# Patient Record
Sex: Male | Born: 1985 | Hispanic: Yes | Marital: Married | Smoking: Never smoker
Health system: Southern US, Community
[De-identification: ages and names within clinical notes are randomized; demographics above are authoritative.]

## PROBLEM LIST (undated history)

## (undated) DIAGNOSIS — J45909 Unspecified asthma, uncomplicated: Secondary | ICD-10-CM

## (undated) DIAGNOSIS — K429 Umbilical hernia without obstruction or gangrene: Secondary | ICD-10-CM

## (undated) HISTORY — DX: Unspecified asthma, uncomplicated: J45.909

---
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
## 2013-02-02 ENCOUNTER — Encounter (HOSPITAL_COMMUNITY): Payer: Self-pay | Admitting: Emergency Medicine

## 2013-02-02 ENCOUNTER — Emergency Department (HOSPITAL_COMMUNITY)
Admission: EM | Admit: 2013-02-02 | Discharge: 2013-02-02 | Disposition: A | Discharge status: Home / Self Care | Payer: Self-pay | Attending: Emergency Medicine | Admitting: Emergency Medicine

## 2013-02-02 ENCOUNTER — Emergency Department (HOSPITAL_COMMUNITY): Payer: Self-pay

## 2013-02-02 DIAGNOSIS — R1013 Epigastric pain: Secondary | ICD-10-CM | POA: Insufficient documentation

## 2013-02-02 DIAGNOSIS — R109 Unspecified abdominal pain: Secondary | ICD-10-CM

## 2013-02-02 LAB — URINALYSIS, ROUTINE W REFLEX MICROSCOPIC
Bilirubin Urine: NEGATIVE
Nitrite: NEGATIVE
Protein, ur: 30 mg/dL — AB
Specific Gravity, Urine: 1.027 (ref 1.005–1.030)
Urobilinogen, UA: 1 mg/dL (ref 0.0–1.0)

## 2013-02-02 LAB — COMPREHENSIVE METABOLIC PANEL
ALT: 18 U/L (ref 0–53)
BUN: 17 mg/dL (ref 6–23)
CO2: 21 mEq/L (ref 19–32)
Calcium: 9 mg/dL (ref 8.4–10.5)
GFR calc Af Amer: >90 mL/min (ref >90)
GFR calc non Af Amer: >90 mL/min (ref >90)
Glucose, Bld: 91 mg/dL (ref 70–99)
Sodium: 137 mEq/L (ref 135–145)
Total Protein: 7.7 g/dL (ref 6.0–8.3)

## 2013-02-02 LAB — CBC WITH DIFFERENTIAL/PLATELET
Basophils Absolute: 0 10*3/uL (ref 0.0–0.1)
Eosinophils Absolute: 0.1 10*3/uL (ref 0.0–0.7)
Eosinophils Relative: 1 % (ref 0–5)
MCH: 30.8 pg (ref 26.0–34.0)
MCV: 86.6 fL (ref 78.0–100.0)
Neutrophils Relative %: 61 % (ref 43–77)
Platelets: 271 10*3/uL (ref 150–400)
RDW: 12.3 % (ref 11.5–15.5)
WBC: 8 10*3/uL (ref 4.0–10.5)

## 2013-02-02 LAB — URINE MICROSCOPIC-ADD ON

## 2013-02-02 LAB — LIPASE, BLOOD: Lipase: 20 U/L (ref 11–59)

## 2013-02-02 MED ORDER — SODIUM CHLORIDE 0.9 % IV SOLN
1000.0000 mL | INTRAVENOUS | Status: DC
  Administered 2013-02-02 (×2): 1000 mL via INTRAVENOUS

## 2013-02-02 MED ORDER — SODIUM CHLORIDE 0.9 % IV SOLN: 1000.0000 mL | Freq: Once | INTRAVENOUS | Status: DC

## 2013-02-02 MED ORDER — PANTOPRAZOLE SODIUM 20 MG PO TBEC: 20.0000 mg | DELAYED_RELEASE_TABLET | Freq: Every day | ORAL | Status: DC

## 2013-02-02 MED ORDER — HYDROCODONE-ACETAMINOPHEN 5-325 MG PO TABS: 1.0000 | ORAL_TABLET | Freq: Four times a day (QID) | ORAL | Status: DC | PRN

## 2013-02-02 NOTE — ED Provider Notes (Signed)
History     CSN: [id number]  Arrival date & time 02/02/13  1942   First MD Initiated Contact with Patient 02/02/13 2036      Chief Complaint  Patient presents with  . Abdominal Pain    HPI Comments: He notices that it increases with deep breathing.  Patient is a 27 y.o. male presenting with abdominal pain. The history is provided by the patient.  Abdominal Pain Pain location:  Epigastric Pain severity:  Moderate Duration:  3 days Timing:  Constant Progression:  Waxing and waning Context: not alcohol use   Worsened by:  Deep breathing Ineffective treatments:  OTC medications and NSAIDs Associated symptoms: no anorexia, no belching, no chest pain, no dysuria, no fever and no nausea     History reviewed. No pertinent past medical history.  Past Surgical History  Procedure Laterality Date  . Hip surgery      No family history on file.  History  Substance Use Topics  . Smoking status: Never Smoker   . Smokeless tobacco: Not on file  . Alcohol Use: Yes     Comment: occ      Review of Systems  Constitutional: Negative for fever.  Cardiovascular: Negative for chest pain.  Gastrointestinal: Positive for abdominal pain. Negative for nausea and anorexia.  Genitourinary: Negative for dysuria.  All other systems reviewed and are negative.    Allergies  Review of patient's allergies indicates no known allergies.  Home Medications   Current Outpatient Rx  Name  Route  Sig  Dispense  Refill  . ibuprofen (ADVIL,MOTRIN) 200 MG tablet   Oral   Take 400 mg by mouth every 6 (six) hours as needed (pain).         Marland Kitchen HYDROcodone-acetaminophen (NORCO) 5-325 MG per tablet   Oral   Take 1-2 tablets by mouth every 6 (six) hours as needed for pain.   16 tablet   0   . pantoprazole (PROTONIX) 20 MG tablet   Oral   Take 1 tablet (20 mg total) by mouth daily.   20 tablet   0     BP 128/76  Pulse 80  Temp(Src) 98.3 F (36.8 C) (Oral)  Resp 18  Wt 187 lb (84.823  kg)  SpO2 98%  Physical Exam  Nursing note and vitals reviewed. Constitutional: He appears well-developed and well-nourished. No distress.  HENT:  Head: Normocephalic and atraumatic.  Right Ear: External ear normal.  Left Ear: External ear normal.  Eyes: Conjunctivae are normal. Right eye exhibits no discharge. Left eye exhibits no discharge. No scleral icterus.  Neck: Neck supple. No tracheal deviation present.  Cardiovascular: Normal rate, regular rhythm and intact distal pulses.   Pulmonary/Chest: Effort normal and breath sounds normal. No stridor. No respiratory distress. He has no wheezes. He has no rales.  Abdominal: Soft. Bowel sounds are normal. He exhibits no distension and no mass. There is tenderness in the epigastric area. There is no rebound, no guarding and no CVA tenderness. No hernia.  Musculoskeletal: He exhibits no edema and no tenderness.  Neurological: He is alert. He has normal strength. No sensory deficit. Cranial nerve deficit:  no gross defecits noted. He exhibits normal muscle tone. He displays no seizure activity. Coordination normal.  Skin: Skin is warm and dry. No rash noted.  Psychiatric: He has a normal mood and affect.    ED Course  Procedures (including critical care time)  Rate: 65  Rhythm: normal sinus rhythm  QRS Axis: normal  Intervals:  normal  ST/T Wave abnormalities: normal  Conduction Disutrbances:none  Narrative Interpretation:   Old EKG Reviewed: none available  Labs Reviewed  URINALYSIS, ROUTINE W REFLEX MICROSCOPIC - Abnormal; Notable for the following:    APPearance TURBID (*)    Hgb urine dipstick SMALL (*)    Protein, ur 30 (*)    All other components within normal limits  COMPREHENSIVE METABOLIC PANEL  LIPASE, BLOOD  CBC WITH DIFFERENTIAL  URINE MICROSCOPIC-ADD ON   Dg Chest 2 View  02/02/2013  *RADIOLOGY REPORT*  Clinical Data: Epigastric pain  CHEST - 2 VIEW  Comparison: None.  Findings: Lungs are clear. No pleural effusion  or pneumothorax. The cardiomediastinal contours are within normal limits. The visualized bones and soft tissues are without significant appreciable abnormality.  IMPRESSION: No radiographic evidence of acute cardiopulmonary process.   Original Report Authenticated By: Jearld Lesch, M.D.      1. Abdominal pain       MDM  Benign exam.  Will dc home on antacids.  Doubt acute emergency medical condition.        Celene Kras, MD 02/04/13 [phone]

## 2013-02-02 NOTE — ED Notes (Signed)
Pt c/o LUQ pain onset 3 days, worse with breathing and movement, denies injury or recent illness.

## 2013-10-17 IMAGING — CR DG CHEST 2V
2 series · 2 of 2 positions shown · non-contrast
Comparison: None.

CLINICAL DATA: Epigastric pain

CHEST - 2 VIEW

[w chest pa]
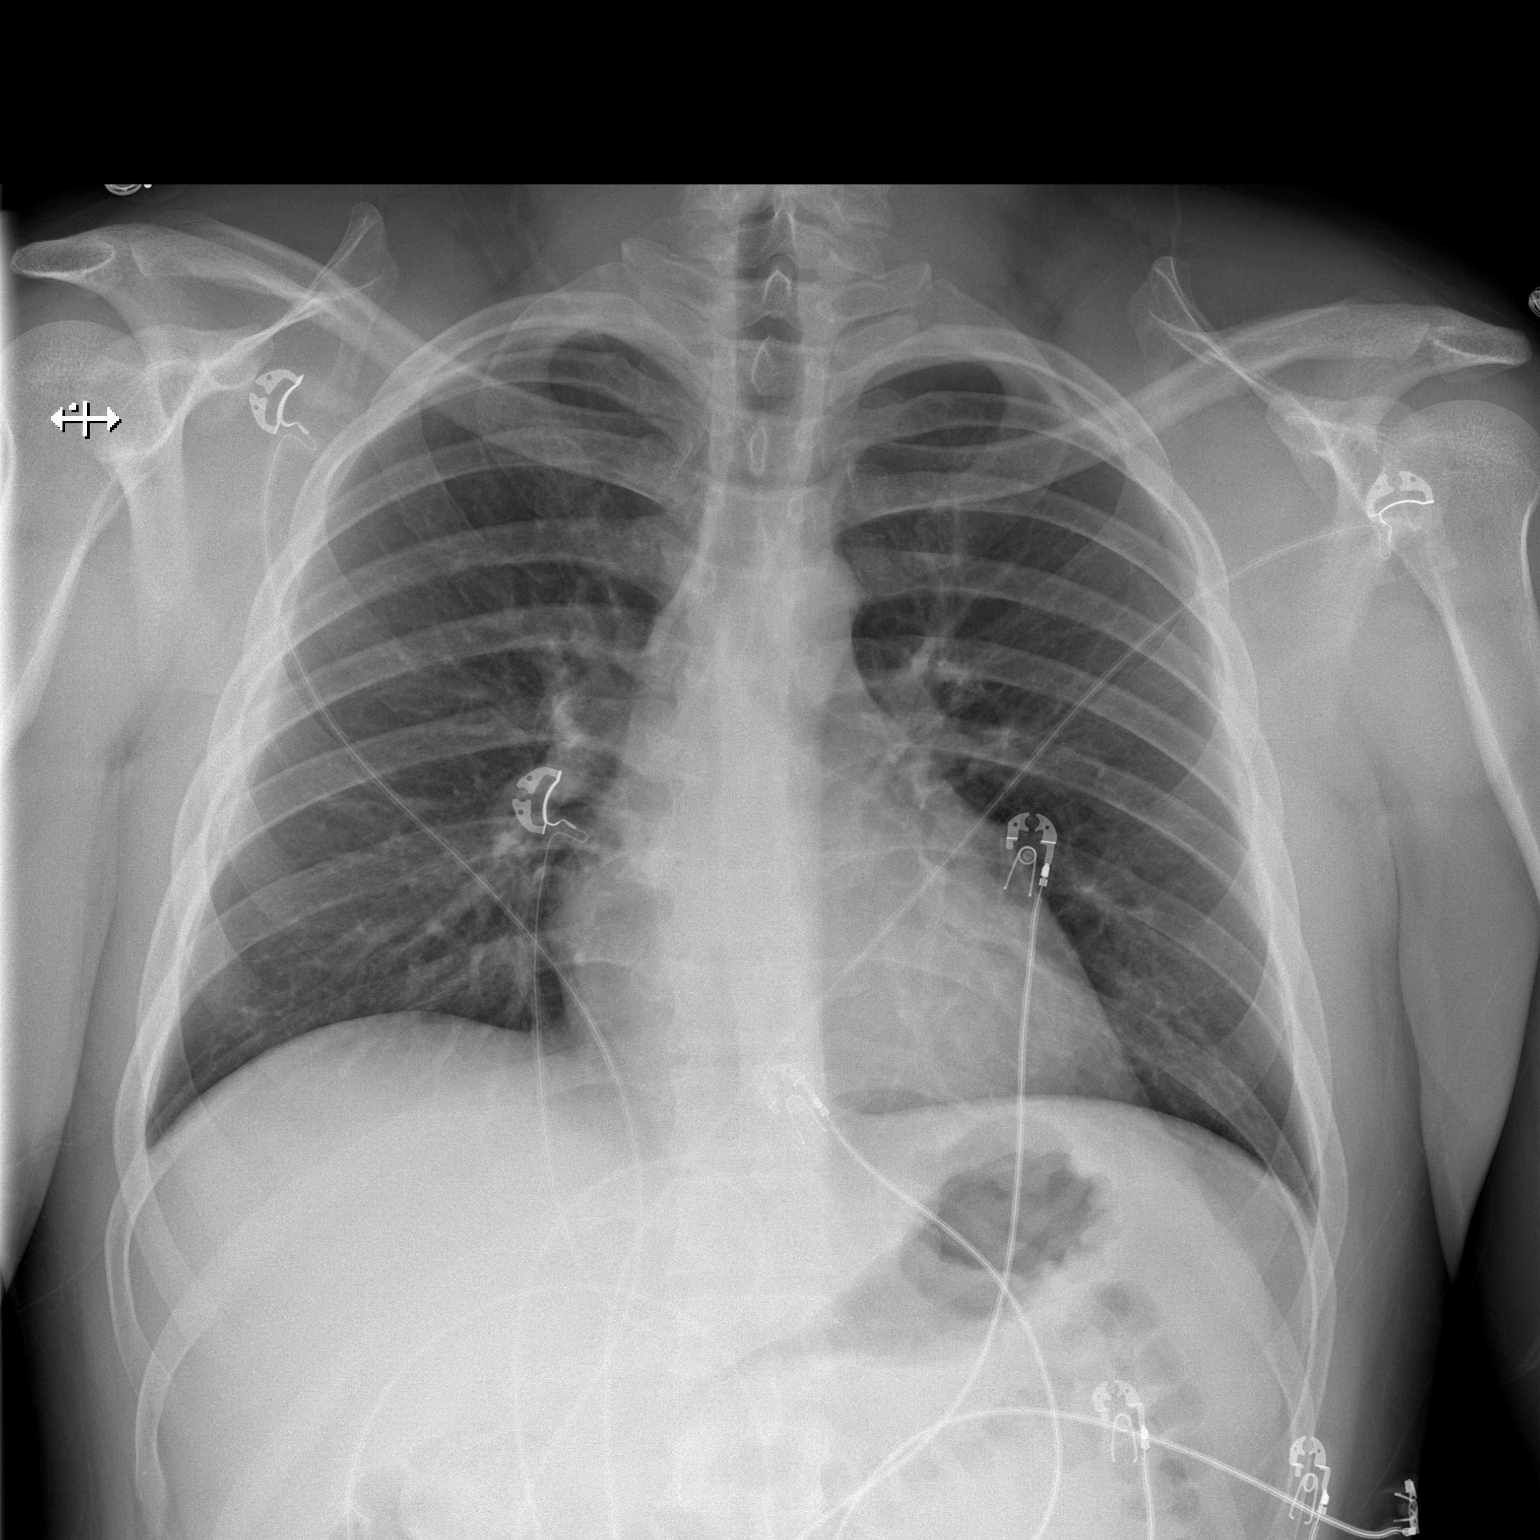

[w chest lat]
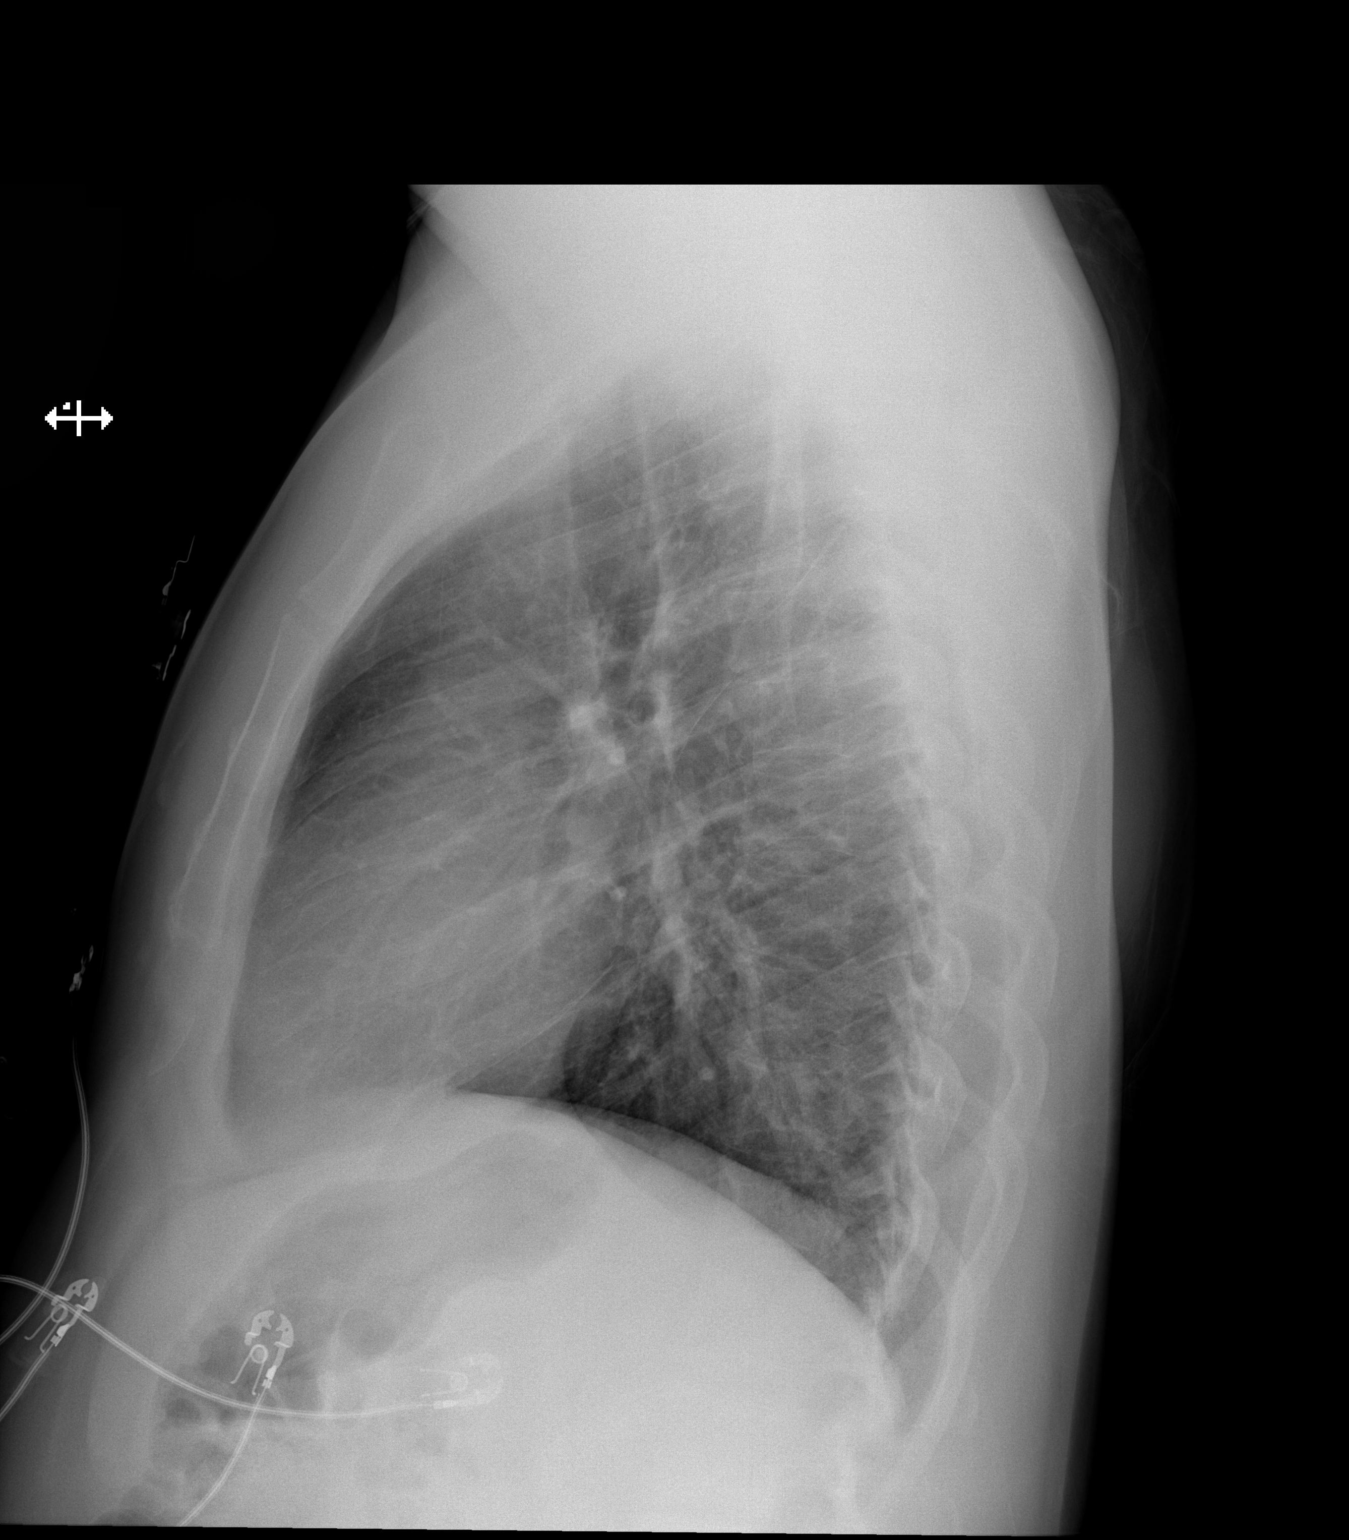

[2 of 2 positions shown; findings below may reference images not displayed]

FINDINGS: Lungs are clear. No pleural effusion or pneumothorax. The
cardiomediastinal contours are within normal limits. The visualized
bones and soft tissues are without significant appreciable
abnormality.
IMPRESSION: No radiographic evidence of acute cardiopulmonary process.

## 2015-02-04 ENCOUNTER — Ambulatory Visit (INDEPENDENT_AMBULATORY_CARE_PROVIDER_SITE_OTHER): Payer: Self-pay | Admitting: Internal Medicine

## 2015-02-04 VITALS — BP 108/76 | HR 66 | Temp 98.4°F | Resp 18 | Ht 66.0 in | Wt 193.0 lb

## 2015-02-04 DIAGNOSIS — J200 Acute bronchitis due to Mycoplasma pneumoniae: Secondary | ICD-10-CM

## 2015-02-04 DIAGNOSIS — J040 Acute laryngitis: Secondary | ICD-10-CM

## 2015-02-04 MED ORDER — HYDROCODONE-ACETAMINOPHEN 7.5-325 MG/15ML PO SOLN: 10.0000 mL | Freq: Four times a day (QID) | ORAL | Status: DC | PRN

## 2015-02-04 MED ORDER — AZITHROMYCIN 500 MG PO TABS: 500.0000 mg | ORAL_TABLET | Freq: Every day | ORAL | Status: DC

## 2015-02-04 NOTE — Patient Instructions (Addendum)
Acute Bronchitis Bronchitis is inflammation of the airways that extend from the windpipe into the lungs (bronchi). The inflammation often causes mucus to develop. This leads to a cough, which is the most common symptom of bronchitis.  In acute bronchitis, the condition usually develops suddenly and goes away over time, usually in a couple weeks. Smoking, allergies, and asthma can make bronchitis worse. Repeated episodes of bronchitis may cause further lung problems.  CAUSES Acute bronchitis is most often caused by the same virus that causes a cold. The virus can spread from person to person (contagious) through coughing, sneezing, and touching contaminated objects. SIGNS AND SYMPTOMS   Cough.   Fever.   Coughing up mucus.   Body aches.   Chest congestion.   Chills.   Shortness of breath.   Sore throat.  DIAGNOSIS  Acute bronchitis is usually diagnosed through a physical exam. Your health care provider will also ask you questions about your medical history. Tests, such as chest X-rays, are sometimes done to rule out other conditions.  TREATMENT  Acute bronchitis usually goes away in a couple weeks. Oftentimes, no medical treatment is necessary. Medicines are sometimes given for relief of fever or cough. Antibiotic medicines are usually not needed but may be prescribed in certain situations. In some cases, an inhaler may be recommended to help reduce shortness of breath and control the cough. A cool mist vaporizer may also be used to help thin bronchial secretions and make it easier to clear the chest.  HOME CARE INSTRUCTIONS  Get plenty of rest.   Drink enough fluids to keep your urine clear or pale yellow (unless you have a medical condition that requires fluid restriction). Increasing fluids may help thin your respiratory secretions (sputum) and reduce chest congestion, and it will prevent dehydration.   Take medicines only as directed by your health care provider.  If  you were prescribed an antibiotic medicine, finish it all even if you start to feel better.  Avoid smoking and secondhand smoke. Exposure to cigarette smoke or irritating chemicals will make bronchitis worse. If you are a smoker, consider using nicotine gum or skin patches to help control withdrawal symptoms. Quitting smoking will help your lungs heal faster.   Reduce the chances of another bout of acute bronchitis by washing your hands frequently, avoiding people with cold symptoms, and trying not to touch your hands to your mouth, nose, or eyes.   Keep all follow-up visits as directed by your health care provider.  SEEK MEDICAL CARE IF: Your symptoms do not improve after 1 week of treatment.  SEEK IMMEDIATE MEDICAL CARE IF:  You develop an increased fever or chills.   You have chest pain.   You have severe shortness of breath.  You have bloody sputum.   You develop dehydration.  You faint or repeatedly feel like you are going to pass out.  You develop repeated vomiting.  You develop a severe headache. MAKE SURE YOU:   Understand these instructions.  Will watch your condition.  Will get help right away if you are not doing well or get worse. Document Released: 12/30/2004 Document Revised: 04/08/2014 Document Reviewed: 05/15/2013 ExitCare Patient Information 2015 [employer]. This information is not intended to replace advice given to you by your health care provider. Make sure you discuss any questions you have with your health care provider. Laryngitis At the top of your windpipe is your voice box. It is the source of your voice. Inside your voice box are 2   bands of muscles called vocal cords. When you breathe, your vocal cords are relaxed and open so that air can get into the lungs. When you decide to say something, these cords come together and vibrate. The sound from these vibrations goes into your throat and comes out through your mouth as sound. Laryngitis is an  inflammation of the vocal cords that causes hoarseness, cough, loss of voice, sore throat, and dry throat. Laryngitis can be temporary (acute) or long-term (chronic). Most cases of acute laryngitis improve with time.Chronic laryngitis lasts for more than 3 weeks. CAUSES Laryngitis can often be related to excessive smoking, talking, or yelling, as well as inhalation of toxic fumes and allergies. Acute laryngitis is usually caused by a viral infection, vocal strain, measles or mumps, or bacterial infections. Chronic laryngitis is usually caused by vocal cord strain, vocal cord injury, postnasal drip, growths on the vocal cords, or acid reflux. SYMPTOMS   Cough.  Sore throat.  Dry throat. RISK FACTORS  Respiratory infections.  Exposure to irritating substances, such as cigarette smoke, excessive amounts of alcohol, stomach acids, and workplace chemicals.  Voice trauma, such as vocal cord injury from shouting or speaking too loud. DIAGNOSIS  Your cargiver will perform a physical exam. During the physical exam, your caregiver will examine your throat. The most common sign of laryngitis is hoarseness. Laryngoscopy may be necessary to confirm the diagnosis of this condition. This procedure allows your caregiver to look into the larynx. HOME CARE INSTRUCTIONS  Drink enough fluids to keep your urine clear or pale yellow.  Rest until you no longer have symptoms or as directed by your caregiver.  Breathe in moist air.  Take all medicine as directed by your caregiver.  Do not smoke.  Talk as little as possible (this includes whispering).  Write on paper instead of talking until your voice is back to normal.  Follow up with your caregiver if your condition has not improved after 10 days. SEEK MEDICAL CARE IF:   You have trouble breathing.  You cough up blood.  You have persistent fever.  You have increasing pain.  You have difficulty swallowing. MAKE SURE YOU:  Understand these  instructions.  Will watch your condition.  Will get help right away if you are not doing well or get worse. Document Released: 11/22/2005 Document Revised: 02/14/2012 Document Reviewed: 01/28/2011 ExitCare Patient Information 2015 [employer]. This information is not intended to replace advice given to you by your health care provider. Make sure you discuss any questions you have with your health care provider.  

## 2015-02-04 NOTE — Progress Notes (Signed)
   Subjective:    Patient ID: Peter Kelly, male    DOB: 07-24-1986, 29 y.o.   MRN: [id number]  HPI Has 4 children who have been sick. He has cough, cp, hoarseness.    Review of Systems     Objective:   Physical Exam  Constitutional: He is oriented to person, place, and time. He appears well-developed and well-nourished. He is active and cooperative. He has a sickly appearance. No distress.  HENT:  Head: Normocephalic.  Nose: Mucosal edema, rhinorrhea and sinus tenderness present. Right sinus exhibits no maxillary sinus tenderness and no frontal sinus tenderness. Left sinus exhibits no maxillary sinus tenderness and no frontal sinus tenderness.  Mouth/Throat: Uvula is midline and mucous membranes are normal. No uvula swelling. Posterior oropharyngeal edema and posterior oropharyngeal erythema present.  Eyes: EOM are normal. Pupils are equal, round, and reactive to light.  Neck: Normal range of motion.  Cardiovascular: Normal rate.   Pulmonary/Chest: Effort normal. No tachypnea. He has no decreased breath sounds. He has no wheezes. He has rhonchi. He has no rales.  Musculoskeletal: Normal range of motion.  Lymphadenopathy:    He has cervical adenopathy.  Neurological: He is alert and oriented to person, place, and time. He exhibits normal muscle tone. Coordination normal.  Psychiatric: He has a normal mood and affect.          Assessment & Plan:  Bronchitis/Laryngitis Zithromax/Lortab

## 2015-06-18 ENCOUNTER — Ambulatory Visit (INDEPENDENT_AMBULATORY_CARE_PROVIDER_SITE_OTHER): Payer: Self-pay | Admitting: Family Medicine

## 2015-06-18 VITALS — BP 108/80 | HR 60 | Temp 98.1°F | Resp 18 | Ht 66.0 in | Wt 198.2 lb

## 2015-06-18 DIAGNOSIS — K429 Umbilical hernia without obstruction or gangrene: Secondary | ICD-10-CM

## 2015-06-18 NOTE — Patient Instructions (Signed)
[facility]  They will plan to see you later this week. They will contact you with the appointment.  If you haven't heard from them by tomorrow please call them at [phone].  Their address is: [address] [place], [place]  If you start feeling worse before seeing them or start having fevers or chills please go to the ER asap.

## 2015-06-18 NOTE — Progress Notes (Signed)
   Subjective:    Patient ID: Peter Kelly, male    DOB: 21-Jul-1986, 29 y.o.   MRN: [id number]  Chief Complaint  Patient presents with  . small lump    under belly button x been there for months  . Abdominal Pain    x 4 days   There are no active problems to display for this patient.  Prior to Admission medications   Medication Sig Start Date End Date Taking? Authorizing Provider  pantoprazole (PROTONIX) 20 MG tablet Take 1 tablet (20 mg total) by mouth daily. Patient not taking: Reported on 02/04/2015 02/02/13   Linwood DibblesJon Knapp, MD   Medications, allergies, past medical history, surgical history, family history, social history and problem list reviewed and updated.  HPI  [age] yom presents with abd lump.   Has had known umbilical lump which he thought was a hernia for past 6 months. Has never been able to reduce it. Four days ago was lifting heavy boxes and noticed pain at site of hernia. Pain has gradually worsened past few days.   Denies fevers, chills, n/v, diarrhea. Has been able to eat ok.   Review of Systems See HPI.     Objective:   Physical Exam  Constitutional: He is oriented to person, place, and time.  Non-toxic appearance. He does not have a sickly appearance. He does not appear ill.  BP 108/80 mmHg  Pulse 60  Temp(Src) 98.1 F (36.7 C) (Oral)  Resp 18  Ht 5\' 6"  (1.676 m)  Wt 198 lb 3.2 oz (89.903 kg)  BMI 32.01 kg/m2  SpO2 98%   Abdominal: Soft. Normal appearance and bowel sounds are normal. There is generalized tenderness. A hernia is present. Hernia confirmed positive in the ventral area.    Mild generalized abd ttp. TTP worst just distal to umbilicus, at site of approx 1 cm soft bulge that is not reducible. No surrounding erythema, no warmth.   Neurological: He is alert and oriented to person, place, and time.      Assessment & Plan:   Umbilical hernia, recurrence not specified --bulge likely small umbilical hernia --not reducible but no signs  strangulation with no surrounding erythema, warmth, no n/v, normal vitals --Dr Patsy Lageropland [facility], they will plan to see pt later this week --er if pain worsens or starts having fevers, chills, nausea  Donnajean Lopesodd M. Ellese Julius, PA-C Physician Assistant-Certified [facility]  06/18/2015 2:10 PM

## 2015-06-20 ENCOUNTER — Other Ambulatory Visit: Payer: Self-pay | Admitting: General Surgery

## 2015-06-23 ENCOUNTER — Encounter (HOSPITAL_COMMUNITY): Payer: Self-pay | Admitting: *Deleted

## 2015-06-24 ENCOUNTER — Ambulatory Visit (HOSPITAL_COMMUNITY): Payer: Self-pay | Admitting: Anesthesiology

## 2015-06-24 ENCOUNTER — Encounter (HOSPITAL_COMMUNITY)
Admission: RE | Disposition: A | Discharge status: Home / Self Care | Payer: Self-pay | Source: Ambulatory Visit | Attending: General Surgery

## 2015-06-24 ENCOUNTER — Ambulatory Visit (HOSPITAL_COMMUNITY)
Admission: RE | Admit: 2015-06-24 | Discharge: 2015-06-24 | Disposition: A | Discharge status: Home / Self Care | Payer: Self-pay | Source: Ambulatory Visit | Attending: General Surgery | Admitting: General Surgery

## 2015-06-24 ENCOUNTER — Encounter (HOSPITAL_COMMUNITY): Payer: Self-pay

## 2015-06-24 DIAGNOSIS — Z859 Personal history of malignant neoplasm, unspecified: Secondary | ICD-10-CM | POA: Insufficient documentation

## 2015-06-24 DIAGNOSIS — K420 Umbilical hernia with obstruction, without gangrene: Secondary | ICD-10-CM | POA: Insufficient documentation

## 2015-06-24 DIAGNOSIS — J45909 Unspecified asthma, uncomplicated: Secondary | ICD-10-CM | POA: Insufficient documentation

## 2015-06-24 HISTORY — DX: Umbilical hernia without obstruction or gangrene: K42.9

## 2015-06-24 HISTORY — PX: UMBILICAL HERNIA REPAIR: SHX196

## 2015-06-24 HISTORY — PX: INSERTION OF MESH: SHX5868

## 2015-06-24 LAB — CBC
HCT: 42 % (ref 39.0–52.0)
HEMOGLOBIN: 14.6 g/dL (ref 13.0–17.0)
MCH: 31.5 pg (ref 26.0–34.0)
MCHC: 34.8 g/dL (ref 30.0–36.0)
MCV: 90.7 fL (ref 78.0–100.0)
PLATELETS: 261 10*3/uL (ref 150–400)
RBC: 4.63 MIL/uL (ref 4.22–5.81)
RDW: 12.6 % (ref 11.5–15.5)
WBC: 8 10*3/uL (ref 4.0–10.5)

## 2015-06-24 SURGERY — REPAIR, HERNIA, UMBILICAL, ADULT
Anesthesia: General

## 2015-06-24 MED ORDER — LIDOCAINE HCL (CARDIAC) 20 MG/ML IV SOLN
INTRAVENOUS | Status: AC
  Filled 2015-06-24: qty 5

## 2015-06-24 MED ORDER — CEFAZOLIN SODIUM-DEXTROSE 2-3 GM-% IV SOLR
2.0000 g | INTRAVENOUS | Status: AC
  Administered 2015-06-24: 2 g via INTRAVENOUS

## 2015-06-24 MED ORDER — PROPOFOL 10 MG/ML IV BOLUS
INTRAVENOUS | Status: DC | PRN
  Administered 2015-06-24: 200 mg via INTRAVENOUS

## 2015-06-24 MED ORDER — HYDROMORPHONE HCL 1 MG/ML IJ SOLN
INTRAMUSCULAR | Status: DC
Start: 2015-06-24 — End: 2015-06-24
  Filled 2015-06-24: qty 1

## 2015-06-24 MED ORDER — FENTANYL CITRATE (PF) 100 MCG/2ML IJ SOLN
INTRAMUSCULAR | Status: DC | PRN
  Administered 2015-06-24 (×3): 50 ug via INTRAVENOUS

## 2015-06-24 MED ORDER — DEXAMETHASONE SODIUM PHOSPHATE 10 MG/ML IJ SOLN
INTRAMUSCULAR | Status: AC
Start: 2015-06-24 — End: 2015-06-24
  Filled 2015-06-24: qty 1

## 2015-06-24 MED ORDER — LACTATED RINGERS IV SOLN
INTRAVENOUS | Status: DC
  Administered 2015-06-24: 1000 mL via INTRAVENOUS

## 2015-06-24 MED ORDER — ROCURONIUM BROMIDE 100 MG/10ML IV SOLN
INTRAVENOUS | Status: AC
Start: 2015-06-24 — End: 2015-06-24
  Filled 2015-06-24: qty 1

## 2015-06-24 MED ORDER — PROPOFOL 10 MG/ML IV BOLUS
INTRAVENOUS | Status: AC
  Filled 2015-06-24: qty 20

## 2015-06-24 MED ORDER — EPHEDRINE SULFATE 50 MG/ML IJ SOLN
INTRAMUSCULAR | Status: AC
  Filled 2015-06-24: qty 1

## 2015-06-24 MED ORDER — CHLORHEXIDINE GLUCONATE 4 % EX LIQD: 1.0000 "application " | Freq: Once | CUTANEOUS | Status: DC

## 2015-06-24 MED ORDER — MIDAZOLAM HCL 5 MG/5ML IJ SOLN
INTRAMUSCULAR | Status: DC | PRN
  Administered 2015-06-24: 2 mg via INTRAVENOUS

## 2015-06-24 MED ORDER — SODIUM CHLORIDE 0.9 % IJ SOLN
INTRAMUSCULAR | Status: AC
Start: 2015-06-24 — End: 2015-06-24
  Filled 2015-06-24: qty 10

## 2015-06-24 MED ORDER — PROMETHAZINE HCL 25 MG/ML IJ SOLN: 6.2500 mg | INTRAMUSCULAR | Status: DC | PRN

## 2015-06-24 MED ORDER — BUPIVACAINE-EPINEPHRINE (PF) 0.25% -1:200000 IJ SOLN
INTRAMUSCULAR | Status: AC
  Filled 2015-06-24: qty 30

## 2015-06-24 MED ORDER — ONDANSETRON HCL 4 MG/2ML IJ SOLN
INTRAMUSCULAR | Status: AC
Start: 2015-06-24 — End: 2015-06-24
  Filled 2015-06-24: qty 2

## 2015-06-24 MED ORDER — LIDOCAINE HCL (CARDIAC) 20 MG/ML IV SOLN
INTRAVENOUS | Status: DC | PRN
  Administered 2015-06-24: 50 mg via INTRAVENOUS

## 2015-06-24 MED ORDER — OXYCODONE HCL 5 MG PO TABS: 5.0000 mg | ORAL_TABLET | ORAL | Status: DC | PRN

## 2015-06-24 MED ORDER — FENTANYL CITRATE (PF) 250 MCG/5ML IJ SOLN
INTRAMUSCULAR | Status: AC
  Filled 2015-06-24: qty 5

## 2015-06-24 MED ORDER — BUPIVACAINE-EPINEPHRINE 0.25% -1:200000 IJ SOLN
INTRAMUSCULAR | Status: DC | PRN
  Administered 2015-06-24: 30 mL

## 2015-06-24 MED ORDER — ONDANSETRON HCL 4 MG/2ML IJ SOLN
INTRAMUSCULAR | Status: DC | PRN
  Administered 2015-06-24: 4 mg via INTRAVENOUS

## 2015-06-24 MED ORDER — MIDAZOLAM HCL 2 MG/2ML IJ SOLN
INTRAMUSCULAR | Status: AC
Start: 2015-06-24 — End: 2015-06-24
  Filled 2015-06-24: qty 2

## 2015-06-24 MED ORDER — CEFAZOLIN SODIUM-DEXTROSE 2-3 GM-% IV SOLR
INTRAVENOUS | Status: AC
  Filled 2015-06-24: qty 50

## 2015-06-24 MED ORDER — DEXAMETHASONE SODIUM PHOSPHATE 10 MG/ML IJ SOLN
INTRAMUSCULAR | Status: DC | PRN
  Administered 2015-06-24: 10 mg via INTRAVENOUS

## 2015-06-24 MED ORDER — HYDROMORPHONE HCL 1 MG/ML IJ SOLN
0.2500 mg | INTRAMUSCULAR | Status: DC | PRN
  Administered 2015-06-24 (×2): 0.5 mg via INTRAVENOUS

## 2015-06-24 SURGICAL SUPPLY — 37 items
BENZOIN TINCTURE PRP APPL 2/3 (GAUZE/BANDAGES/DRESSINGS) IMPLANT
BLADE HEX COATED 2.75 (ELECTRODE) ×3 IMPLANT
BLADE SURG SZ10 CARB STEEL (BLADE) ×3 IMPLANT
CLOSURE WOUND 1/2 X4 (GAUZE/BANDAGES/DRESSINGS)
COVER SURGICAL LIGHT HANDLE (MISCELLANEOUS) ×3 IMPLANT
DECANTER SPIKE VIAL GLASS SM (MISCELLANEOUS) IMPLANT
DRAPE LAPAROTOMY T 102X78X121 (DRAPES) ×3 IMPLANT
ELECT REM PT RETURN 9FT ADLT (ELECTROSURGICAL) ×3
ELECTRODE REM PT RTRN 9FT ADLT (ELECTROSURGICAL) ×1 IMPLANT
GAUZE SPONGE 4X4 12PLY STRL (GAUZE/BANDAGES/DRESSINGS) IMPLANT
GLOVE BIOGEL PI IND STRL 7.0 (GLOVE) ×1 IMPLANT
GLOVE BIOGEL PI INDICATOR 7.0 (GLOVE) ×2
GOWN STRL REUS W/TWL LRG LVL3 (GOWN DISPOSABLE) ×3 IMPLANT
GOWN STRL REUS W/TWL XL LVL3 (GOWN DISPOSABLE) ×6 IMPLANT
KIT BASIN OR (CUSTOM PROCEDURE TRAY) ×3 IMPLANT
LIQUID BAND (GAUZE/BANDAGES/DRESSINGS) ×6 IMPLANT
MARKER SKIN DUAL TIP RULER LAB (MISCELLANEOUS) IMPLANT
MESH VENTRALEX ST 1-7/10 CRC S (Mesh General) ×3 IMPLANT
NEEDLE HYPO 22GX1.5 SAFETY (NEEDLE) ×3 IMPLANT
NEEDLE HYPO 25X1 1.5 SAFETY (NEEDLE) IMPLANT
NS IRRIG 1000ML POUR BTL (IV SOLUTION) ×6 IMPLANT
PACK BASIC VI WITH GOWN DISP (CUSTOM PROCEDURE TRAY) ×3 IMPLANT
PENCIL BUTTON HOLSTER BLD 10FT (ELECTRODE) ×3 IMPLANT
SPONGE LAP 4X18 X RAY DECT (DISPOSABLE) ×3 IMPLANT
STRIP CLOSURE SKIN 1/2X4 (GAUZE/BANDAGES/DRESSINGS) IMPLANT
SUT MNCRL AB 4-0 PS2 18 (SUTURE) ×3 IMPLANT
SUT NOVA 0 T19/GS 22DT (SUTURE) ×3 IMPLANT
SUT PROLENE 0 CT 1 30 (SUTURE) IMPLANT
SUT PROLENE 0 CT 1 CR/8 (SUTURE) IMPLANT
SUT PROLENE 0 CT 2 (SUTURE) IMPLANT
SUT SILK 3 0 (SUTURE)
SUT SILK 3 0 SH CR/8 (SUTURE) IMPLANT
SUT SILK 3-0 18XBRD TIE 12 (SUTURE) IMPLANT
SUT VIC AB 3-0 SH 27 (SUTURE)
SUT VIC AB 3-0 SH 27XBRD (SUTURE) IMPLANT
SYR CONTROL 10ML LL (SYRINGE) ×3 IMPLANT
TOWEL OR 17X26 10 PK STRL BLUE (TOWEL DISPOSABLE) ×3 IMPLANT

## 2015-06-24 NOTE — H&P (Signed)
  History of Present Illness Peter Kelly(Conny Situ T. Appolonia Ackert MD; 06/20/2015 11:34 AM) Patient words: umb hernia.  The patient is a 29 year old male who presents with an umbilical hernia. He comes to the office with recent onset of the pain and bulge at his umbilicus. 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 He was doing heavy lifting about 2 weeks ago and developed stinging pain and some swelling in his umbilicus. This has persisted since that time. Not severe pain but uncomfortable. He is felt maybe a little nausea today. No previous history of surgery in that area.   Other Problems Peter Kelly(Ashley Beck, CMA; 06/20/2015 11:10 AM) Other disease, cancer, significant illness  Past Surgical History Peter Kelly(Ashley Beck, CMA; 06/20/2015 11:10 AM) No pertinent past surgical history  Allergies Peter Kelly(Ashley Beck, CMA; 06/20/2015 11:10 AM) No Known Drug Allergies07/15/2016  Medication History Peter Kelly(Ashley Beck, CMA; 06/20/2015 11:11 AM) No Current Medications Medications Reconciled  Social History Peter Kelly(Ashley Beck, CMA; 06/20/2015 11:10 AM) Alcohol use Occasional alcohol use. Caffeine use Carbonated beverages.  Family History Peter Kelly(Ashley Beck, [place]; 06/20/2015 11:10 AM) Cervical Cancer Mother. Diabetes Mellitus Family Members In General.  Review of Systems Peter Kelly(Ashley Beck CMA; 06/20/2015 11:10 AM) General Present- Appetite Loss and Fatigue. Not Present- Chills, Fever, Night Sweats, Weight Gain and Weight Loss. Skin Not Present- Change in Wart/Mole, Dryness, Hives, Jaundice, New Lesions, Non-Healing Wounds, Rash and Ulcer. HEENT Present- Sore Throat. Not Present- Earache, Hearing Loss, Hoarseness, Nose Bleed, Oral Ulcers, Ringing in the Ears, Seasonal Allergies, Sinus Pain, Visual Disturbances, Wears glasses/contact lenses and Yellow Eyes. Breast Not Present- Breast Mass, Breast Pain, Nipple Discharge and Skin Changes. Cardiovascular Present- Chest Pain and Shortness of Breath. Not Present- Difficulty Breathing Lying Down, Leg Cramps,  Palpitations, Rapid Heart Rate and Swelling of Extremities. Gastrointestinal Present- Abdominal Pain, Bloating and Excessive gas. Not Present- Bloody Stool, Change in Bowel Habits, Chronic diarrhea, Constipation, Difficulty Swallowing, Gets full quickly at meals, Hemorrhoids, Indigestion, Nausea, Rectal Pain and Vomiting. Male Genitourinary Not Present- Blood in Urine, Change in Urinary Stream, Frequency, Impotence, Nocturia, Painful Urination, Urgency and Urine Leakage.   Vitals Peter Kelly(Ashley Beck CMA; 06/20/2015 11:11 AM) 06/20/2015 11:11 AM Weight: 198 lb Height: 66in Body Surface Area: 2.05 m Body Mass Index: 31.96 kg/m Temp.: 98.34F(Oral)  Pulse: 85 (Regular)  BP: 128/70 (Sitting, Left Arm, Standard)    Physical Exam Peter Kelly(Olajuwon Fosdick T. Brantlee Hinde MD; 06/20/2015 11:35 AM) The physical exam findings are as follows: Note:General: Healthy-appearing Hispanic male no distress Skin: No rash or infection Lungs: Clear equal breath sounds bilaterally Cardiac: Regular rate and rhythm Abdomen: Nondistended. Generally soft and nontender. There is an approximately 1.5 cm slightly tender noninflamed soft mass at the umbilicus consistent with an incarcerated umbilical hernia probably preperitoneal fat but no inflammation.    Assessment & Plan Peter Kelly(Dacoda Finlay T. Margareta Laureano MD; 06/20/2015 11:37 AM) UMBILICAL HERNIA, INCARCERATED (552.1  K42.0) Impression: Small symptomatic incarcerated umbilical hernia probably containing preperitoneal fat. I discussed the diagnosis with the patient and recommended repair under general anesthesia as an outpatient with mesh. We discussed the procedure and recovery as well as risks of bleeding, infection, recurrence. He was given literature regarding the procedure. We'll schedule this urgently. Current Plans  Pt Education - Pamphlet Given - Hernia Surgery: discussed with patient and provided information. Schedule for Surgery Repair of umbilical hernia with mesh under general  anesthesia as an outpatient

## 2015-06-24 NOTE — Anesthesia Procedure Notes (Signed)
Procedure Name: LMA Insertion Date/Time: 06/24/2015 11:05 AM Performed by: Thornell MuleSTUBBLEFIELD, Keneth Borg G Pre-anesthesia Checklist: Patient identified, Emergency Drugs available, Suction available, Patient being monitored and Timeout performed Patient Re-evaluated:Patient Re-evaluated prior to inductionOxygen Delivery Method: Circle system utilized Preoxygenation: Pre-oxygenation with 100% oxygen Intubation Type: IV induction Ventilation: Mask ventilation without difficulty LMA: LMA inserted LMA Size: 4.0 Number of attempts: 1 Placement Confirmation: positive ETCO2 and breath sounds checked- equal and bilateral Tube secured with: Tape

## 2015-06-24 NOTE — Transfer of Care (Signed)
Immediate Anesthesia Transfer of Care Note  Patient: Peter Kelly  Procedure(s) Performed: Procedure(s): UMBILICAL HERNIA REPAIR  (N/A) INSERTION OF MESH (N/A)  Patient Location: PACU  Anesthesia Type:General  Level of Consciousness: awake, alert  and oriented  Airway & Oxygen Therapy: Patient Spontanous Breathing and Patient connected to face mask oxygen  Post-op Assessment: Report given to RN and Post -op Vital signs reviewed and stable  Post vital signs: Reviewed and stable  Last Vitals:  Filed Vitals:   06/24/15 0951  BP: 126/79  Pulse: 63  Temp: 37.1 C  Resp: 16    Complications: No apparent anesthesia complications

## 2015-06-24 NOTE — Anesthesia Preprocedure Evaluation (Signed)
Anesthesia Evaluation  Patient identified by MRN, date of birth, ID band Patient awake    Reviewed: Allergy & Precautions, NPO status , Patient's Chart, lab work & pertinent test results  Airway Mallampati: II  TM Distance: >3 FB Neck ROM: Full    Dental   Pulmonary asthma ,  breath sounds clear to auscultation        Cardiovascular negative cardio ROS  Rhythm:Regular Rate:Normal     Neuro/Psych    GI/Hepatic Neg liver ROS, GI history noted. CE   Endo/Other    Renal/GU      Musculoskeletal   Abdominal   Peds  Hematology   Anesthesia Other Findings   Reproductive/Obstetrics                             Anesthesia Physical Anesthesia Plan  ASA: II  Anesthesia Plan: General   Post-op Pain Management:    Induction: Intravenous  Airway Management Planned: LMA  Additional Equipment:   Intra-op Plan:   Post-operative Plan: Extubation in OR  Informed Consent: I have reviewed the patients History and Physical, chart, labs and discussed the procedure including the risks, benefits and alternatives for the proposed anesthesia with the patient or authorized representative who has indicated his/her understanding and acceptance.   Dental advisory given  Plan Discussed with: CRNA and Anesthesiologist  Anesthesia Plan Comments:         Anesthesia Quick Evaluation

## 2015-06-24 NOTE — Op Note (Signed)
Preoperative Diagnosis: umbilical hernia  Postoprative Diagnosis: umbilical hernia  Procedure: Procedure(s): UMBILICAL HERNIA REPAIR  INSERTION OF MESH   Surgeon: Glenna FellowsHoxworth, Jahir Halt T   Assistants: none  Anesthesia:  General LMA anesthesia  Indications: patient is a 29 year old male who presents with about 2 weeks of a somewhat painful very tender mass at his umbilicus. Exam is consistent with a chronically incarcerated small umbilical hernia. I recommended repair under general anesthesia. We have discussed the procedure and risks, indications and recovery detailed elsewhere.    Procedure Detail:  Patient was brought to the operating room, placed in the supine position on the operating table, and laryngeal mask general anesthesia induced. He received preoperative IV antibiotics. PAS were in place. The abdomen was widely sterilely prepped and draped. Patient timeout was performed and correct procedure verified. A curvilinear incision just beneath the umbilicus was used and dissection carried down through the subcutaneous tissue. There was obvious incarcerated preperitoneal fat. The umbilical skin was dissected up off of the level of the fascia and the small defect and hernia contents completely exposed. The herniated preperitoneal fat was amputated with cautery at the level of the fascia in the defect itself measured only about half a centimeter. The fascia was identified in all directions. The peritoneum was opened. A 4.5 cm ventral patch was used, deployed into the peritoneal cavity and then deployed out laterally in all directions smoothly. With the tails pulled up snugly the fascial defect was closed transversely with 3 interrupted 0 Novafil sutures incorporating the tails of the mesh which were then trimmed away. Hemostasis was assured. The soft tissue was infiltrated with Marcaine. The umbilical skin was tacked back down to the level of the fascia. The subcutaneous was closed with interrupted  4-0 Monocryl in the skin and subcutaneous 4-0 Monocryl and Dermabond. Sponge needle and instrument counts were correct.    Findings: As above  Estimated Blood Loss:  Minimal         Drains: none  Blood Given: none          Specimens: none        Complications:  * No complications entered in OR log *         Disposition: PACU - hemodynamically stable.         Condition: stable

## 2015-06-24 NOTE — Discharge Instructions (Signed)
[facility] _______[facility]  UMBILICAL OR INGUINAL HERNIA REPAIR: POST OP INSTRUCTIONS  Always review your discharge instruction sheet given to you by the facility where your surgery was performed. IF YOU HAVE DISABILITY OR FAMILY LEAVE FORMS, YOU MUST BRING THEM TO THE OFFICE FOR PROCESSING.   DO NOT GIVE THEM TO YOUR DOCTOR.  1. A  prescription for pain medication may be given to you upon discharge.  Take your pain medication as prescribed, if needed.  If narcotic pain medicine is not needed, then you may take acetaminophen (Tylenol) or ibuprofen (Advil) as needed. 2. Take your usually prescribed medications unless otherwise directed. 3. If you need a refill on your pain medication, please contact your pharmacy.  They will contact our office to request authorization. Prescriptions will not be filled after 5 pm or on week-ends. 4. You should follow a light diet the first 24 hours after arrival home, such as soup and crackers, etc.  Be sure to include lots of fluids daily.  Resume your normal diet the day after surgery. 5. Most patients will experience some swelling and bruising around the umbilicus or in the groin and scrotum.  Ice packs and reclining will help.  Swelling and bruising can take several days to resolve.  6. It is common to experience some constipation if taking pain medication after surgery.  Increasing fluid intake and taking a stool softener (such as Colace) will usually help or prevent this problem from occurring.  A mild laxative (Milk of Magnesia or Miralax) should be taken according to package directions if there are no bowel movements after 48 hours. 7. Unless discharge instructions indicate otherwise, you may remove your bandages 24-48 hours after surgery, and you may shower at that time.  You may have steri-strips (small skin tapes) in place directly over the incision.  These strips should be left on the skin for 7-10 days.  If your surgeon used skin glue on the  incision, you may shower in 24 hours.  The glue will flake off over the next 2-3 weeks.  Any sutures or staples will be removed at the office during your follow-up visit. 8. ACTIVITIES:  You may resume regular (light) daily activities beginning the next day--such as daily self-care, walking, climbing stairs--gradually increasing activities as tolerated.  You may have sexual intercourse when it is comfortable.  Refrain from any heavy lifting or straining until approved by your doctor. a. You may drive when you are no longer taking prescription pain medication, you can comfortably wear a seatbelt, and you can safely maneuver your car and apply brakes. b. RETURN TO WORK:  __________________________________________________________ 9. You should see your doctor in the office for a follow-up appointment approximately 2-3 weeks after your surgery.  Make sure that you call for this appointment within a day or two after you arrive home to insure a convenient appointment time. 10. OTHER INSTRUCTIONS:  __________________________________________________________________________________________________________________________________________________________________________________________  WHEN TO CALL YOUR DOCTOR: 1. Fever over 101.0 2. Inability to urinate 3. Nausea and/or vomiting 4. Extreme swelling or bruising 5. Continued bleeding from incision. 6. Increased pain, redness, or drainage from the incision  The clinic staff is available to answer your questions during regular business hours.  Please don't hesitate to call and ask to speak to one of the nurses for clinical concerns.  If you have a medical emergency, go to the nearest emergency room or call 911.  A surgeon from [facility] is always on call at the hospital     [address], [address], [place], [place]  [address] ?  [address] [address], [place],    [address] [phone] ? [phone] ? FAX [phone] Web site:  [email or web address]  

## 2015-06-24 NOTE — Interval H&P Note (Signed)
History and Physical Interval Note:  06/24/2015 10:46 AM  Peter Kelly  has presented today for surgery, with the diagnosis of umbilical hernia  The various methods of treatment have been discussed with the patient and family. After consideration of risks, benefits and other options for treatment, the patient has consented to  Procedure(s): UMBILICAL HERNIA REPAIR  (N/A) INSERTION OF MESH (N/A) as a surgical intervention .  The patient's history has been reviewed, patient examined, no change in status, stable for surgery.  I have reviewed the patient's chart and labs.  Questions were answered to the patient's satisfaction.     Zane Pellecchia T

## 2015-06-24 NOTE — Anesthesia Postprocedure Evaluation (Signed)
  Anesthesia Post-op Note  Patient: Peter Kelly  Procedure(s) Performed: Procedure(s): UMBILICAL HERNIA REPAIR  (N/A) INSERTION OF MESH (N/A)  Patient Location: PACU  Anesthesia Type:General  Level of Consciousness: awake  Airway and Oxygen Therapy: Patient Spontanous Breathing  Post-op Pain: mild  Post-op Assessment: Post-op Vital signs reviewed              Post-op Vital Signs: Reviewed  Last Vitals:  Filed Vitals:   06/24/15 1254  BP: 103/65  Pulse: 59  Temp:   Resp: 18    Complications: No apparent anesthesia complications

## 2015-06-25 ENCOUNTER — Encounter (HOSPITAL_COMMUNITY): Payer: Self-pay | Admitting: General Surgery

## 2018-04-21 ENCOUNTER — Ambulatory Visit: Payer: Self-pay | Admitting: Family Medicine

## 2018-04-25 ENCOUNTER — Ambulatory Visit: Payer: Self-pay | Admitting: Family Medicine

## 2018-08-23 ENCOUNTER — Encounter: Payer: Self-pay | Admitting: Gastroenterology

## 2018-08-25 ENCOUNTER — Encounter: Payer: Self-pay | Admitting: Gastroenterology

## 2018-08-31 ENCOUNTER — Encounter: Payer: Self-pay | Admitting: Gastroenterology

## 2018-08-31 ENCOUNTER — Ambulatory Visit (INDEPENDENT_AMBULATORY_CARE_PROVIDER_SITE_OTHER): Payer: Self-pay | Admitting: Gastroenterology

## 2018-08-31 VITALS — BP 120/78 | HR 78 | Ht 63.0 in | Wt 209.2 lb

## 2018-08-31 DIAGNOSIS — R12 Heartburn: Secondary | ICD-10-CM

## 2018-08-31 DIAGNOSIS — R1084 Generalized abdominal pain: Secondary | ICD-10-CM

## 2018-08-31 MED ORDER — SUCRALFATE 1 GM/10ML PO SUSP: 1.0000 g | Freq: Three times a day (TID) | ORAL | 0 refills | Status: DC

## 2018-08-31 MED ORDER — PANTOPRAZOLE SODIUM 20 MG PO TBEC: 20.0000 mg | DELAYED_RELEASE_TABLET | Freq: Every day | ORAL | 11 refills | Status: DC

## 2018-08-31 NOTE — Progress Notes (Signed)
Chief Complaint: Abdominal pain   Referring Provider:  Seymour Bars, MD (emergency department)    HPI:     Peter Kelly is a 32 y.o. male referred to the [facility]ology Clinic for evaluation of MEG/LUQ pain.  Pain is been present for the last 3 months, increasing in frequency and intensity over recent weeks to the point of having to present to the emergency department on 08/23/2018.  Pain described as pressure type pain which can radiate to the left shoulder/shoulder blade.  Pain is postprandial, occurring immediately after ingestion of any type of food.  No certain foods worse than others.  Does have associated nausea and nonbloody emesis.  Has decreased p.o. intake for fear of inciting pain.  No overt GI bleed.  Prior to 3 months ago, no similar symptoms and no known pre-existing history of reflux.  He does endorse recent heartburn with these episodes but otherwise no dysphagia or odynophagia.  No associated weight loss.    Went to ER on 08/23/18 for the symptoms. Also with HB. No dysphagia. No previous EGD. Labs: per report: normal lipase, WBC. Treated with Xylocaine, Norco, GI Cocktail with relief of sxs. Discharged with Protonix 20 mg daily, with imporovement, but still with LUQ pain, but improved HB.  No previous EGD or colonoscopy.  No known family history of CRC, GI malignancy, liver disease, pancreatic disease, or IBD.   Past Medical History:  Diagnosis Date  . Asthma   . Umbilical hernia      Past Surgical History:  Procedure Laterality Date  . HIP SURGERY  2001  . INSERTION OF MESH N/A 06/24/2015   Procedure: INSERTION OF MESH;  Surgeon: Glenna Fellows, MD;  Location: [facility];  Service: General;  Laterality: N/A;  . UMBILICAL HERNIA REPAIR N/A 06/24/2015   Procedure: UMBILICAL HERNIA REPAIR ;  Surgeon: Glenna Fellows, MD;  Location: [facility];  Service: General;  Laterality: N/A;   Family History  Problem Relation Age of Onset  . Cervical  cancer Mother   . Diabetes Maternal Grandmother   . Colon cancer Neg Hx   . Esophageal cancer Neg Hx    Social History   Tobacco Use  . Smoking status: Never Smoker  . Smokeless tobacco: Never Used  . Tobacco comment: tried it once or twice   Substance Use Topics  . Alcohol use: Yes    Comment: rare  . Drug use: No   Current Outpatient Medications  Medication Sig Dispense Refill  . oxyCODONE (OXY IR/ROXICODONE) 5 MG immediate release tablet Take 1-2 tablets (5-10 mg total) by mouth every 4 (four) hours as needed for moderate pain, severe pain or breakthrough pain. 40 tablet 0  . pantoprazole (PROTONIX) 20 MG tablet Take 20 mg by mouth daily.  0   No current facility-administered medications for this visit.    No Known Allergies   Review of Systems: All systems reviewed and negative except where noted in HPI.     Physical Exam:    Wt Readings from Last 3 Encounters:  08/31/18 209 lb 4 oz (94.9 kg)  06/24/15 197 lb 6 oz (89.5 kg)  06/18/15 198 lb 3.2 oz (89.9 kg)    BP 120/78   Pulse 78   Ht 5\' 3"  (1.6 m)   Wt 209 lb 4 oz (94.9 kg)   BMI 37.07 kg/m  Constitutional:  Pleasant, in no acute distress. Psychiatric: Normal mood and affect. Behavior is  normal. EENT: Pupils normal.  Conjunctivae are normal. No scleral icterus. Neck supple. No cervical LAD. Cardiovascular: Normal rate, regular rhythm. No edema Pulmonary/chest: Effort normal and breath sounds normal. No wheezing, rales or rhonchi. Abdominal: Soft, nondistended, nontender. Bowel sounds active throughout. There are no masses palpable. No hepatomegaly. Neurological: Alert and oriented to person place and time. Skin: Skin is warm and dry. No rashes noted.   ASSESSMENT AND PLAN;   Peter Kelly is a 32 y.o. male presenting with 3 months of LUQ/MEG pain along with heartburn.  1) abdominal pain: Symptoms overall decreased in intensity and frequency since starting Protonix 20 mg daily very clinical  presentation suspicious for PUD, gastritis.  Discussed further evaluation and treatment options and will proceed as below: -EGD to evaluate for PUD, gastritis, H. pylori, with random and directed biopsies -Increase Protonix to 20 mg p.o. twice daily for 6 to 8 weeks -Counseled on appropriately takin PPI 30 to 60 minutes before breakfast and dinner - Provided sample for Carafate to take up to 4 times daily as needed for abdominal pain and to promote healing of suspected ulcer  2) heartburn: -Increase Protonix to 20 mg p.o. twice daily for diagnostic and therapeutic content -Counseled on antireflux lifestyle measures -Can evaluate for objective evidence of reflux, to include erosive esophagitis at time of EGD as above - Given relatively new onset of symptoms, may not need long-term acid suppression therapy.  We will plan on titrating to lowest effective dose or off completely pending response to therapy and endoscopic findings.  The indications, risks, and benefits of EGD were explained to the patient in detail. Risks include but are not limited to bleeding, perforation, adverse reaction to medications, and cardiopulmonary compromise. Sequelae include but are not limited to the possibility of surgery, hositalization, and mortality. The patient verbalized understanding and wished to proceed. All questions answered, referred to scheduler. Further recommendations pending results of the exam.     Peter Dike Francely Craw, DO, FACG  08/31/2018, 3:50 PM   No ref. provider found

## 2018-08-31 NOTE — Patient Instructions (Signed)
If you are age 31 or older, your body mass index should be between 23-30. Your Body mass index is 37.07 kg/m. If this is out of the aforementioned range listed, please consider follow up with your Primary Care Provider.  If you are age 41 or younger, your body mass index should be between 19-25. Your Body mass index is 37.07 kg/m. If this is out of the aformentioned range listed, please consider follow up with your Primary Care Provider.   You have been scheduled for an endoscopy. Please follow written instructions given to you at your visit today. If you use inhalers (even only as needed), please bring them with you on the day of your procedure. Your physician has requested that you go to www.startemmi.com and enter the access code given to you at your visit today. This web site gives a general overview about your procedure. However, you should still follow specific instructions given to you by our office regarding your preparation for the procedure.  We have sent the following medications to your pharmacy for you to pick up at your convenience: Carafate 10ml 4 times daily as needed Protonix 20mg  twice daily.  It was a pleasure to see you today!  Vito Cirigliano, D.O.

## 2018-09-13 ENCOUNTER — Encounter: Payer: Self-pay | Admitting: Gastroenterology

## 2018-09-13 ENCOUNTER — Ambulatory Visit (AMBULATORY_SURGERY_CENTER): Payer: Self-pay | Admitting: Gastroenterology

## 2018-09-13 VITALS — BP 111/86 | HR 75 | Temp 98.4°F | Resp 29 | Ht 63.0 in | Wt 209.0 lb

## 2018-09-13 DIAGNOSIS — R12 Heartburn: Secondary | ICD-10-CM

## 2018-09-13 DIAGNOSIS — K2970 Gastritis, unspecified, without bleeding: Secondary | ICD-10-CM

## 2018-09-13 DIAGNOSIS — R112 Nausea with vomiting, unspecified: Secondary | ICD-10-CM

## 2018-09-13 DIAGNOSIS — R1084 Generalized abdominal pain: Secondary | ICD-10-CM

## 2018-09-13 MED ORDER — SODIUM CHLORIDE 0.9 % IV SOLN: 500.0000 mL | Freq: Once | INTRAVENOUS | Status: DC

## 2018-09-13 NOTE — Progress Notes (Signed)
Pt's states no medical or surgical changes since previsit or office visit. 

## 2018-09-13 NOTE — Progress Notes (Signed)
Report given to PACU, vss 

## 2018-09-13 NOTE — Progress Notes (Signed)
Called to room to assist during endoscopic procedure.  Patient ID and intended procedure confirmed with present staff. Received instructions for my participation in the procedure from the performing physician.  

## 2018-09-13 NOTE — Op Note (Signed)
[facility] Patient Name: Peter Kelly Procedure Date: 09/13/2018 10:23 AM MRN: [id number] Endoscopist: Doristine Locks , MD Age: 32 Referring MD:  Date of Birth: 1986-03-18 Gender: Male Account #: [id number] Procedure:                Upper GI endoscopy Indications:              Epigastric abdominal pain, Abdominal pain in the                            left upper quadrant, Heartburn, Suspected                            esophageal reflux, Nausea with vomiting Medicines:                Monitored Anesthesia Care Procedure:                Pre-Anesthesia Assessment:                           - Prior to the procedure, a History and Physical                            was performed, and patient medications and                            allergies were reviewed. The patient's tolerance of                            previous anesthesia was also reviewed. The risks                            and benefits of the procedure and the sedation                            options and risks were discussed with the patient.                            All questions were answered, and informed consent                            was obtained. Prior Anticoagulants: The patient has                            taken no previous anticoagulant or antiplatelet                            agents. ASA Grade Assessment: II - A patient with                            mild systemic disease. After reviewing the risks                            and benefits, the patient was deemed in  satisfactory condition to undergo the procedure.                           After obtaining informed consent, the endoscope was                            passed under direct vision. Throughout the                            procedure, the patient's blood pressure, pulse, and                            oxygen saturations were monitored continuously. The                            Endoscope was  introduced through the mouth, and                            advanced to the second part of duodenum. The upper                            GI endoscopy was accomplished without difficulty.                            The patient tolerated the procedure well. Scope In: Scope Out: Findings:                 The examined esophagus was normal.                           Esophagogastric landmarks were identified: the                            Z-line was found at 40 cm, the gastroesophageal                            junction was found at 40 cm and the site of hiatal                            narrowing was found at 40 cm from the incisors.                           The gastroesophageal flap valve was visualized                            endoscopically and classified as Hill Grade II                            (fold present, opens with respiration). There was a                            <2 cm transverse width hernia noted on retroflexed  views. No axial hernia noted on anterograde views.                           Diffuse moderate inflammation characterized by                            erosions and erythema was found in the entire                            examined stomach. There was a mosaic, "snakeskin"                            appearance in the gastric body and fundus. Biopsies                            were taken with a cold forceps for Helicobacter                            pylori testing. Estimated blood loss was minimal.                           The pylorus was patent and easily traversed. The                            duodenal bulb, first portion of the duodenum and                            second portion of the duodenum were normal. Complications:            No immediate complications. Estimated Blood Loss:     Estimated blood loss was minimal. Impression:               - Normal esophagus.                           - Esophagogastric landmarks  identified.                           - Gastroesophageal flap valve classified as Hill                            Grade II (fold present, opens with respiration).                           - There was a <2 cm transverse width hernia noted                            on retroflexed views. No axial hernia noted on                            anterograde views.                           - Gastritis. Biopsied.                           -  Normal duodenal bulb, first portion of the                            duodenum and second portion of the duodenum. Recommendation:           - Patient has a contact number available for                            emergencies. The signs and symptoms of potential                            delayed complications were discussed with the                            patient. Return to normal activities tomorrow.                            Written discharge instructions were provided to the                            patient.                           - Resume previous diet today.                           - Continue present medications.                           - Await pathology results.                           - Perform esophageal manometry and ambulatory pH                            monitoring (off PPI) at appointment to be scheduled.                           - Return to GI clinic after studies are complete. Doristine Locks, MD 09/13/2018 10:46:30 AM

## 2018-09-13 NOTE — Patient Instructions (Signed)
YOU HAD AN ENDOSCOPIC PROCEDURE TODAY AT THE [facility]:   Refer to the procedure report that was given to you for any specific questions about what was found during the examination.  If the procedure report does not answer your questions, please call your gastroenterologist to clarify.  If you requested that your care partner not be given the details of your procedure findings, then the procedure report has been included in a sealed envelope for you to review at your convenience later.  YOU SHOULD EXPECT: Some feelings of bloating in the abdomen. Passage of more gas than usual.  Walking can help get rid of the air that was put into your GI tract during the procedure and reduce the bloating. If you had a lower endoscopy (such as a colonoscopy or flexible sigmoidoscopy) you may notice spotting of blood in your stool or on the toilet paper. If you underwent a bowel prep for your procedure, you may not have a normal bowel movement for a few days.  Please Note:  You might notice some irritation and congestion in your nose or some drainage.  This is from the oxygen used during your procedure.  There is no need for concern and it should clear up in a day or so.  SYMPTOMS TO REPORT IMMEDIATELY:    Following upper endoscopy (EGD)  Vomiting of blood or coffee ground material  New chest pain or pain under the shoulder blades  Painful or persistently difficult swallowing  New shortness of breath  Fever of 100F or higher  Black, tarry-looking stools  For urgent or emergent issues, a gastroenterologist can be reached at any hour by calling [phone].   DIET:  We do recommend a small meal at first, but then you may proceed to your regular diet.  Drink plenty of fluids but you should avoid alcoholic beverages for 24 hours.  ACTIVITY:  You should plan to take it easy for the rest of today and you should NOT DRIVE or use heavy machinery until tomorrow (because of the sedation medicines used  during the test).    FOLLOW UP: Our staff will call the number listed on your records the next business day following your procedure to check on you and address any questions or concerns that you may have regarding the information given to you following your procedure. If we do not reach you, we will leave a message.  However, if you are feeling well and you are not experiencing any problems, there is no need to return our call.  We will assume that you have returned to your regular daily activities without incident.  If any biopsies were taken you will be contacted by phone or by letter within the next 1-3 weeks.  Please call us at [phone] if you have not heard about the biopsies in 3 weeks.    SIGNATURES/CONFIDENTIALITY: You and/or your care partner have signed paperwork which will be entered into your electronic medical record.  These signatures attest to the fact that that the information above on your After Visit Summary has been reviewed and is understood.  Full responsibility of the confidentiality of this discharge information lies with you and/or your care-partner.  A manometry to test stomach acid needs to be done.   The nurses on the 3rd floor will call you to make an appointment.  Read all handouts given to you by your recovery room nurse.

## 2018-09-14 ENCOUNTER — Telehealth: Payer: Self-pay

## 2018-09-14 NOTE — Telephone Encounter (Signed)
  Follow up Call-  Call back number 09/13/2018  Post procedure Call Back phone  # [phone]  Permission to leave phone message Yes  Some recent data might be hidden     Patient questions:  Do you have a fever, pain , or abdominal swelling? No. Pain Score  0 *  Have you tolerated food without any problems? Yes.    Have you been able to return to your normal activities? Yes.    Do you have any questions about your discharge instructions: Diet   No. Medications  No. Follow up visit  No.  Do you have questions or concerns about your Care? Yes.   Pt c/o numbness in lower lip. Small cut no swelling today. Pt advised to call back if it does not start to get better tomorrow.  Actions: * If pain score is 4 or above: No action needed, pain <4.

## 2018-09-15 ENCOUNTER — Telehealth: Payer: Self-pay

## 2018-09-15 NOTE — Telephone Encounter (Signed)
Patient has been scheduled for 24 Hour pH probe and esophageal manometry for 10/11/18 10:30.  I left a message for the patient to call and discuss.  Instructions mailed to the patient as well.

## 2018-09-15 NOTE — Telephone Encounter (Signed)
Patient notified of mano and pH probe.  He will call if he has questions once he receives the instructions in the mail.

## 2018-09-20 ENCOUNTER — Other Ambulatory Visit: Payer: Self-pay

## 2018-09-20 DIAGNOSIS — A048 Other specified bacterial intestinal infections: Secondary | ICD-10-CM

## 2018-09-20 MED ORDER — OMEPRAZOLE 20 MG PO CPDR: 20.0000 mg | DELAYED_RELEASE_CAPSULE | Freq: Two times a day (BID) | ORAL | 0 refills | Status: DC

## 2018-09-20 MED ORDER — METRONIDAZOLE 250 MG PO TABS
250.0000 mg | ORAL_TABLET | Freq: Four times a day (QID) | ORAL | 0 refills | Status: AC
Start: 2018-09-20 — End: 2018-10-04

## 2018-09-20 MED ORDER — DOXYCYCLINE HYCLATE 100 MG PO CAPS: 100.0000 mg | ORAL_CAPSULE | Freq: Two times a day (BID) | ORAL | 0 refills | Status: AC

## 2018-09-20 MED ORDER — BISMUTH SUBSALICYLATE 262 MG PO CHEW: 524.0000 mg | CHEWABLE_TABLET | Freq: Four times a day (QID) | ORAL | 0 refills | Status: AC

## 2018-09-29 ENCOUNTER — Ambulatory Visit: Payer: Self-pay | Admitting: Gastroenterology

## 2018-10-11 ENCOUNTER — Ambulatory Visit (HOSPITAL_COMMUNITY)
Admission: RE | Admit: 2018-10-11 | Discharge: 2018-10-11 | Disposition: A | Discharge status: Home / Self Care | Payer: Self-pay | Source: Ambulatory Visit | Attending: Gastroenterology | Admitting: Gastroenterology

## 2018-10-11 ENCOUNTER — Encounter (HOSPITAL_COMMUNITY)
Admission: RE | Disposition: A | Discharge status: Home / Self Care | Payer: Self-pay | Source: Ambulatory Visit | Attending: Gastroenterology

## 2018-10-11 DIAGNOSIS — K219 Gastro-esophageal reflux disease without esophagitis: Secondary | ICD-10-CM | POA: Insufficient documentation

## 2018-10-11 DIAGNOSIS — R12 Heartburn: Secondary | ICD-10-CM

## 2018-10-11 HISTORY — PX: 24 HOUR PH STUDY: SHX5419

## 2018-10-11 HISTORY — PX: ESOPHAGEAL MANOMETRY: SHX5429

## 2018-10-11 SURGERY — MANOMETRY, ESOPHAGUS
Anesthesia: Topical

## 2018-10-11 MED ORDER — LIDOCAINE VISCOUS HCL 2 % MT SOLN
OROMUCOSAL | Status: AC
  Filled 2018-10-11: qty 15

## 2018-10-11 SURGICAL SUPPLY — 2 items
FACESHIELD LNG OPTICON STERILE (SAFETY) IMPLANT
GLOVE BIO SURGEON STRL SZ8 (GLOVE) ×6 IMPLANT

## 2018-10-11 NOTE — Progress Notes (Signed)
Esophageal manometry performed per protocol.  Patient tolerated procedure without complications.  PH probe placed per protocol at 36cm.  Patient tolerated placement without complications.  Patient instructed on how to use monitor and instructed to return tomorrow at 1115 to have probe removed. Dr. Lavon Paganini to interpret results.

## 2018-10-12 ENCOUNTER — Encounter (HOSPITAL_COMMUNITY): Payer: Self-pay | Admitting: Gastroenterology

## 2018-10-20 DIAGNOSIS — K219 Gastro-esophageal reflux disease without esophagitis: Secondary | ICD-10-CM

## 2018-10-20 DIAGNOSIS — R12 Heartburn: Secondary | ICD-10-CM

## 2023-08-01 ENCOUNTER — Other Ambulatory Visit: Payer: Self-pay

## 2023-08-01 ENCOUNTER — Encounter (HOSPITAL_COMMUNITY): Payer: Self-pay

## 2023-08-01 ENCOUNTER — Emergency Department (HOSPITAL_COMMUNITY)
Admission: EM | Admit: 2023-08-01 | Discharge: 2023-08-01 | Disposition: A | Discharge status: Home / Self Care | Payer: Self-pay | Attending: Emergency Medicine | Admitting: Emergency Medicine

## 2023-08-01 ENCOUNTER — Emergency Department (HOSPITAL_COMMUNITY): Payer: Self-pay

## 2023-08-01 DIAGNOSIS — Z1152 Encounter for screening for COVID-19: Secondary | ICD-10-CM | POA: Insufficient documentation

## 2023-08-01 DIAGNOSIS — K529 Noninfective gastroenteritis and colitis, unspecified: Secondary | ICD-10-CM | POA: Insufficient documentation

## 2023-08-01 LAB — CBC
HCT: 42.8 % (ref 39.0–52.0)
Hemoglobin: 14.9 g/dL (ref 13.0–17.0)
MCH: 31.1 pg (ref 26.0–34.0)
MCHC: 34.8 g/dL (ref 30.0–36.0)
MCV: 89.4 fL (ref 80.0–100.0)
Platelets: 299 10*3/uL (ref 150–400)
RBC: 4.79 MIL/uL (ref 4.22–5.81)
RDW: 12.2 % (ref 11.5–15.5)
WBC: 6.7 10*3/uL (ref 4.0–10.5)
nRBC: 0 % (ref 0.0–0.2)

## 2023-08-01 LAB — URINALYSIS, ROUTINE W REFLEX MICROSCOPIC
Bilirubin Urine: NEGATIVE
Glucose, UA: NEGATIVE mg/dL
Ketones, ur: NEGATIVE mg/dL
Leukocytes,Ua: NEGATIVE
Nitrite: NEGATIVE
Protein, ur: 30 mg/dL — AB
Specific Gravity, Urine: 1.014 (ref 1.005–1.030)
pH: 6 (ref 5.0–8.0)

## 2023-08-01 LAB — LIPASE, BLOOD: Lipase: 25 U/L (ref 11–51)

## 2023-08-01 LAB — COMPREHENSIVE METABOLIC PANEL
ALT: 41 U/L (ref 0–44)
AST: 24 U/L (ref 15–41)
Albumin: 4 g/dL (ref 3.5–5.0)
Alkaline Phosphatase: 45 U/L (ref 38–126)
Anion gap: 9 (ref 5–15)
BUN: 8 mg/dL (ref 6–20)
CO2: 23 mmol/L (ref 22–32)
Calcium: 9 mg/dL (ref 8.9–10.3)
Chloride: 105 mmol/L (ref 98–111)
Creatinine, Ser: 0.89 mg/dL (ref 0.61–1.24)
GFR, Estimated: >60 mL/min (ref >60)
Glucose, Bld: 96 mg/dL (ref 70–99)
Potassium: 3.4 mmol/L — ABNORMAL LOW (ref 3.5–5.1)
Sodium: 137 mmol/L (ref 135–145)
Total Bilirubin: 1 mg/dL (ref 0.3–1.2)
Total Protein: 7.2 g/dL (ref 6.5–8.1)

## 2023-08-01 LAB — RESP PANEL BY RT-PCR (RSV, FLU A&B, COVID)  RVPGX2
Influenza A by PCR: NEGATIVE
Influenza B by PCR: NEGATIVE
Resp Syncytial Virus by PCR: NEGATIVE
SARS Coronavirus 2 by RT PCR: NEGATIVE

## 2023-08-01 MED ORDER — IOHEXOL 350 MG/ML SOLN
75.0000 mL | Freq: Once | INTRAVENOUS | Status: AC | PRN
  Administered 2023-08-01: 75 mL via INTRAVENOUS

## 2023-08-01 MED ORDER — FENTANYL CITRATE PF 50 MCG/ML IJ SOSY
50.0000 ug | PREFILLED_SYRINGE | Freq: Once | INTRAMUSCULAR | Status: AC
  Administered 2023-08-01: 50 ug via INTRAVENOUS
  Filled 2023-08-01: qty 1

## 2023-08-01 MED ORDER — ONDANSETRON 4 MG PO TBDP: 4.0000 mg | ORAL_TABLET | Freq: Three times a day (TID) | ORAL | 0 refills | Status: DC | PRN

## 2023-08-01 MED ORDER — ONDANSETRON HCL 4 MG/2ML IJ SOLN
4.0000 mg | Freq: Once | INTRAMUSCULAR | Status: AC
  Administered 2023-08-01: 4 mg via INTRAVENOUS
  Filled 2023-08-01: qty 2

## 2023-08-01 MED ORDER — ONDANSETRON 4 MG PO TBDP
4.0000 mg | ORAL_TABLET | Freq: Once | ORAL | Status: AC | PRN
  Administered 2023-08-01: 4 mg via ORAL
  Filled 2023-08-01: qty 1

## 2023-08-01 MED ORDER — POTASSIUM CHLORIDE CRYS ER 20 MEQ PO TBCR
20.0000 meq | EXTENDED_RELEASE_TABLET | Freq: Once | ORAL | Status: AC
  Administered 2023-08-01: 20 meq via ORAL
  Filled 2023-08-01: qty 1

## 2023-08-01 MED ORDER — LACTATED RINGERS IV BOLUS
1000.0000 mL | Freq: Once | INTRAVENOUS | Status: AC
  Administered 2023-08-01: 1000 mL via INTRAVENOUS

## 2023-08-01 NOTE — ED Notes (Signed)
Pt passed PO challenge.

## 2023-08-01 NOTE — Discharge Instructions (Addendum)
Your laboratory evaluation and CT imaging was overall reassuring.  Your symptoms are consistent with likely viral gastroenteritis.  Zofran has been prescribed for nausea, continue oral rehydration at home and return to the emergency department if you have any severe worsening symptoms, are unable to tolerate oral intake and are concerned for dehydration.

## 2023-08-01 NOTE — ED Triage Notes (Signed)
Pt reports LLQ pain that started last night around 3am, the pain radiated to his right side and now c.o pain only on the RLQ. Pt also c.o nausea and diarrhea. Pt states the pain comes and goes and when the pain hits he has an episode of diarrhea. Pt also c.o a weird taste in his mouth and loss of appetite.

## 2023-08-01 NOTE — ED Provider Notes (Signed)
[place] EMERGENCY DEPARTMENT AT [facility] Provider Note   CSN: [id number] Arrival date & time: 08/01/23  1215     History  Chief Complaint  Patient presents with   Abdominal Pain   Nausea   Diarrhea    Peter Kelly is a 37 y.o. male.   Abdominal Pain Associated symptoms: diarrhea, nausea and vomiting   Diarrhea Associated symptoms: abdominal pain and vomiting      37 year old male presenting to the emergency department with abdominal pain.  The patient states that yesterday he developed left lower quadrant pain that was migratory to his umbilicus and now has settled in his right lower quadrant.  He endorses nausea and vomiting with associated chills, denies any fevers.  He has had a few episodes of loose stools.  He states the pain has come and gone in waves.  He states that the pain is 10 out of 10 at its most severe but currently is 5 out of 10 in severity.  He endorses a weird taste in his mouth and a loss of appetite.  No known sick contacts.  He he is passing gas.  Home Medications Prior to Admission medications   Medication Sig Start Date End Date Taking? Authorizing Provider  ondansetron (ZOFRAN-ODT) 4 MG disintegrating tablet Take 1 tablet (4 mg total) by mouth every 8 (eight) hours as needed. 08/01/23  Yes Ernie Avena, MD  omeprazole (PRILOSEC) 20 MG capsule Take 1 capsule (20 mg total) by mouth 2 (two) times daily for 14 days. 09/20/18 10/04/18  Cirigliano, Vito V, DO  pantoprazole (PROTONIX) 20 MG tablet Take 1 tablet (20 mg total) by mouth daily. 08/31/18   Cirigliano, Vito V, DO  sucralfate (CARAFATE) 1 GM/10ML suspension Take 10 mLs (1 g total) by mouth 4 (four) times daily -  with meals and at bedtime. 08/31/18   Cirigliano, Vito V, DO      Allergies    Patient has no known allergies.    Review of Systems   Review of Systems  Gastrointestinal:  Positive for abdominal pain, diarrhea, nausea and vomiting.  All other systems reviewed and are  negative.   Physical Exam Updated Vital Signs BP 128/82 (BP Location: Left Arm)   Pulse 73   Temp 97.6 F (36.4 C) (Oral)   Resp 18   Ht 5\' 3"  (1.6 m)   Wt 93 kg   SpO2 100%   BMI 36.31 kg/m  Physical Exam Vitals and nursing note reviewed.  Constitutional:      General: He is not in acute distress.    Appearance: He is well-developed.  HENT:     Head: Normocephalic and atraumatic.  Eyes:     Conjunctiva/sclera: Conjunctivae normal.  Cardiovascular:     Rate and Rhythm: Normal rate and regular rhythm.     Heart sounds: No murmur heard. Pulmonary:     Effort: Pulmonary effort is normal. No respiratory distress.     Breath sounds: Normal breath sounds.  Abdominal:     Palpations: Abdomen is soft.     Tenderness: There is abdominal tenderness in the right lower quadrant and suprapubic area. There is guarding. Positive signs include McBurney's sign.  Musculoskeletal:        General: No swelling.     Cervical back: Neck supple.  Skin:    General: Skin is warm and dry.     Capillary Refill: Capillary refill takes less than 2 seconds.  Neurological:     Mental Status: He is  alert.  Psychiatric:        Mood and Affect: Mood normal.     ED Results / Procedures / Treatments   Labs (all labs ordered are listed, but only abnormal results are displayed) Labs Reviewed  COMPREHENSIVE METABOLIC PANEL - Abnormal; Notable for the following components:      Result Value   Potassium 3.4 (*)    All other components within normal limits  URINALYSIS, ROUTINE W REFLEX MICROSCOPIC - Abnormal; Notable for the following components:   Hgb urine dipstick LARGE (*)    Protein, ur 30 (*)    Bacteria, UA RARE (*)    All other components within normal limits  RESP PANEL BY RT-PCR (RSV, FLU A&B, COVID)  RVPGX2  LIPASE, BLOOD  CBC    EKG None  Radiology CT ABDOMEN PELVIS W CONTRAST  Result Date: 08/01/2023 CLINICAL DATA:  Right lower quadrant abdominal pain EXAM: CT ABDOMEN AND  PELVIS WITH CONTRAST TECHNIQUE: Multidetector CT imaging of the abdomen and pelvis was performed using the standard protocol following bolus administration of intravenous contrast. RADIATION DOSE REDUCTION: This exam was performed according to the departmental dose-optimization program which includes automated exposure control, adjustment of the mA and/or kV according to patient size and/or use of iterative reconstruction technique. CONTRAST:  75mL OMNIPAQUE IOHEXOL 350 MG/ML SOLN COMPARISON:  None Available. FINDINGS: Lower chest: No acute abnormality. Hepatobiliary: No solid liver lesion. Incidental note is made of an 8 mm circumscribed water attenuation lesion in segment 5/6 almost certainly representing a benign cyst. No gallstones, gallbladder wall thickening, or biliary dilatation. Pancreas: Unremarkable. No pancreatic ductal dilatation or surrounding inflammatory changes. Spleen: Normal in size without focal abnormality. Adrenals/Urinary Tract: Adrenal glands are unremarkable. Kidneys are normal, without renal calculi, focal lesion, or hydronephrosis. Bladder is unremarkable. Stomach/Bowel: No focal bowel wall thickening or evidence of obstruction. Normal appearance of the stomach and duodenum. The terminal ileum is unremarkable. The appendix is not visualized and may be surgically absent or atretic. Mildly prominent ileocecal lymph nodes measuring up to 1 cm in short axis. Almost certainly reactive in nature. Vascular/Lymphatic: No significant vascular findings are present. No enlarged abdominal or pelvic lymph nodes. Reproductive: Prostate is unremarkable. Other: No abdominal wall hernia or abnormality. Surgical changes of prior umbilical hernia repair with mesh in place. No abdominopelvic ascites. Musculoskeletal: No acute fracture or aggressive appearing lytic or blastic osseous lesion. IMPRESSION: 1. No acute abnormality within the abdomen or pelvis. 2. Mildly prominent ileocecal lymph nodes measuring up  to 1 cm in short axis are almost certainly reactive in nature. 3. Surgical changes of prior umbilical hernia repair with mesh in place. No complication. Electronically Signed   By: Malachy Moan M.D.   On: 08/01/2023 14:58    Procedures Procedures    Medications Ordered in ED Medications  ondansetron (ZOFRAN-ODT) disintegrating tablet 4 mg (4 mg Oral Given 08/01/23 1233)  lactated ringers bolus 1,000 mL (0 mLs Intravenous Stopped 08/01/23 1613)  fentaNYL (SUBLIMAZE) injection 50 mcg (50 mcg Intravenous Given 08/01/23 1402)  ondansetron (ZOFRAN) injection 4 mg (4 mg Intravenous Given 08/01/23 1402)  iohexol (OMNIPAQUE) 350 MG/ML injection 75 mL (75 mLs Intravenous Contrast Given 08/01/23 1420)  potassium chloride SA (KLOR-CON M) CR tablet 20 mEq (20 mEq Oral Given 08/01/23 1611)    ED Course/ Medical Decision Making/ A&P  Medical Decision Making Amount and/or Complexity of Data Reviewed Labs: ordered. Radiology: ordered.  Risk Prescription drug management.     37 year old male presenting to the emergency department with abdominal pain.  The patient states that yesterday he developed left lower quadrant pain that was migratory to his umbilicus and now has settled in his right lower quadrant.  He endorses nausea and vomiting with associated chills, denies any fevers.  He has had a few episodes of loose stools.  He states the pain has come and gone in waves.  He states that the pain is 10 out of 10 at its most severe but currently is 5 out of 10 in severity.  He endorses a weird taste in his mouth and a loss of appetite.  No known sick contacts.  He he is passing gas.  On arrival, the patient was afebrile, heart rate 100, RR 18, BP 194/66, saturating 97% on room air.  Physical exam significant for right lower quadrant tenderness to palpation at McBurney's point with guarding present.  Differential diagnosis includes appendicitis, diverticulitis, bowel  obstruction, gastroenteritis and colitis, nephrolithiasis/pyelonephritis.  IV access was obtained and the patient was administered an IV fluid bolus, IV fentanyl and IV Zofran for pain control and antiemesis.  Initial urinalysis revealed large hemoglobin, rare bacteria, 0-5 WBCs, unconvincing for UTI.  CBC revealed no leukocytosis or anemia.  CMP, lipase, COVID-19 PCR testing collected and pending.  CT Abd Pel: IMPRESSION:  1. No acute abnormality within the abdomen or pelvis.  2. Mildly prominent ileocecal lymph nodes measuring up to 1 cm in  short axis are almost certainly reactive in nature.  3. Surgical changes of prior umbilical hernia repair with mesh in  place. No complication.   Patient overall with nausea, vomiting, diarrhea, consistent with likely gastroenteritis.  He was tolerating oral intake and symptomatically improved on repeat assessment.  CT imaging is overall reassuring with no evidence of acute abnormality and laboratory workup also reassuring.  Patient with mild hypokalemia 3.4, replenished orally.  Overall advised symptomatic management with continued oral rehydration at home, Zofran was prescribed for nausea, return precautions provided in the [facility] and inability to tolerate oral intake with concern for dehydration or any other worsening symptoms.  DC Instructions: Your laboratory evaluation and CT imaging was overall reassuring.  Your symptoms are consistent with likely viral gastroenteritis.  Zofran has been prescribed for nausea, continue oral rehydration at home and return to the emergency department if you have any severe worsening symptoms, are unable to tolerate oral intake and are concerned for dehydration.   Final Clinical Impression(s) / ED Diagnoses Final diagnoses:  Gastroenteritis    Rx / DC Orders ED Discharge Orders          Ordered    ondansetron (ZOFRAN-ODT) 4 MG disintegrating tablet  Every 8 hours PRN        08/01/23 1607               Ernie Avena, MD 08/02/23 [phone]

## 2024-06-02 ENCOUNTER — Encounter (HOSPITAL_BASED_OUTPATIENT_CLINIC_OR_DEPARTMENT_OTHER): Payer: Self-pay | Admitting: Emergency Medicine

## 2024-06-02 ENCOUNTER — Ambulatory Visit (HOSPITAL_BASED_OUTPATIENT_CLINIC_OR_DEPARTMENT_OTHER)
Admission: EM | Admit: 2024-06-02 | Discharge: 2024-06-02 | Disposition: A | Discharge status: Home / Self Care | Payer: Self-pay | Attending: Family Medicine | Admitting: Family Medicine

## 2024-06-02 DIAGNOSIS — K219 Gastro-esophageal reflux disease without esophagitis: Secondary | ICD-10-CM

## 2024-06-02 DIAGNOSIS — R1013 Epigastric pain: Secondary | ICD-10-CM

## 2024-06-02 DIAGNOSIS — Z8619 Personal history of other infectious and parasitic diseases: Secondary | ICD-10-CM

## 2024-06-02 MED ORDER — ALUM & MAG HYDROXIDE-SIMETH 200-200-20 MG/5ML PO SUSP
30.0000 mL | Freq: Once | ORAL | Status: AC
  Administered 2024-06-02: 30 mL via ORAL

## 2024-06-02 MED ORDER — LIDOCAINE VISCOUS HCL 2 % MT SOLN
15.0000 mL | Freq: Once | OROMUCOSAL | Status: AC
  Administered 2024-06-02: 15 mL via OROMUCOSAL

## 2024-06-02 MED ORDER — OMEPRAZOLE 40 MG PO CPDR: 40.0000 mg | DELAYED_RELEASE_CAPSULE | Freq: Every day | ORAL | 0 refills | Status: AC

## 2024-06-02 NOTE — Discharge Instructions (Signed)
 History and exam are most consistent with gastroesophageal reflux disease.  Pain completely resolved after GI cocktail.  Use omeprazole  40 mg 1 daily for acid reflux.  Make an appointment with primary care for further workup of pain and heartburn.  Patient may need stool H. pylori testing or other testing.  Patient may need referral to gastroenterology.  Follow-up here as needed.

## 2024-06-02 NOTE — ED Provider Notes (Signed)
 [facility]    CSN: [id number] Arrival date & time: 06/02/24  1456      History   Chief Complaint No chief complaint on file.   HPI Peter Kelly is a 38 y.o. male.   38 year old male with report of sharp upper abdominal pain or even lower rib cage pain whenever he eats.  Pain started on approximately 05/29/2024.  He feels bloated in his stomach at times.  Had a good bowel movement yesterday.  He denies any diarrhea.  In 2019, he was treated with antibiotics for an H. pylori gastric infection.  Some of his symptoms feel like he felt when he had the H. pylori infection.  He is not aware of having gallbladder disease.  He still has his gallbladder and appendix.     Past Medical History:  Diagnosis Date   Asthma    Umbilical hernia     Patient Active Problem List   Diagnosis Date Noted   Gastroesophageal reflux disease    Heartburn     Past Surgical History:  Procedure Laterality Date   53 HOUR PH STUDY N/A 10/11/2018   Procedure: 24 HOUR PH STUDY;  Surgeon: Shila Gustav GAILS, MD;  Location: [facility];  Service: Endoscopy;  Laterality: N/A;  for dr. San   ESOPHAGEAL MANOMETRY N/A 10/11/2018   Procedure: ESOPHAGEAL MANOMETRY (EM);  Surgeon: Shila Gustav GAILS, MD;  Location: [facility];  Service: Endoscopy;  Laterality: N/A;  for Dr. San   HIP SURGERY  2001   INSERTION OF MESH N/A 06/24/2015   Procedure: INSERTION OF MESH;  Surgeon: Morene Olives, MD;  Location: [facility];  Service: General;  Laterality: N/A;   UMBILICAL HERNIA REPAIR N/A 06/24/2015   Procedure: UMBILICAL HERNIA REPAIR ;  Surgeon: Morene Olives, MD;  Location: [facility];  Service: General;  Laterality: N/A;       Home Medications    Prior to Admission medications   Medication Sig Start Date End Date Taking? Authorizing Provider  omeprazole  (PRILOSEC) 40 MG capsule Take 1 capsule (40 mg total) by mouth daily. 06/02/24 07/02/24 Yes Ival Domino, FNP    Family  History Family History  Problem Relation Age of Onset   Cervical cancer Mother    Diabetes Maternal Grandmother    Colon cancer Neg Hx    Esophageal cancer Neg Hx     Social History Social History   Tobacco Use   Smoking status: Never   Smokeless tobacco: Never   Tobacco comments:    tried it once or twice   Vaping Use   Vaping status: Never Used  Substance Use Topics   Alcohol use: Yes    Comment: rare   Drug use: No     Allergies   Patient has no known allergies.   Review of Systems Review of Systems  Constitutional:  Negative for chills and fever.  HENT:  Negative for ear pain and sore throat.   Eyes:  Negative for pain and visual disturbance.  Respiratory:  Negative for cough.   Cardiovascular:  Negative for chest pain and palpitations.  Gastrointestinal:  Positive for abdominal distention and abdominal pain. Negative for constipation, diarrhea, nausea and vomiting.  Genitourinary:  Negative for dysuria and hematuria.  Musculoskeletal:  Negative for arthralgias and back pain.  Skin:  Negative for color change and rash.  Neurological:  Negative for seizures and syncope.  All other systems reviewed and are negative.    Physical Exam Triage Vital Signs ED Triage Vitals  Encounter  Vitals Group     BP 06/02/24 1509 126/80     Girls Systolic BP Percentile --      Girls Diastolic BP Percentile --      Boys Systolic BP Percentile --      Boys Diastolic BP Percentile --      Pulse Rate 06/02/24 1509 68     Resp 06/02/24 1509 18     Temp 06/02/24 1509 98.4 F (36.9 C)     Temp Source 06/02/24 1509 Oral     SpO2 06/02/24 1509 95 %     Weight --      Height --      Head Circumference --      Peak Flow --      Pain Score 06/02/24 1507 6     Pain Loc --      Pain Education --      Exclude from Growth Chart --    No data found.  Updated Vital Signs BP 126/80 (BP Location: Right Arm)   Pulse 68   Temp 98.4 F (36.9 C) (Oral)   Resp 18   SpO2 95%    Visual Acuity Right Eye Distance:   Left Eye Distance:   Bilateral Distance:    Right Eye Near:   Left Eye Near:    Bilateral Near:     Physical Exam Vitals and nursing note reviewed.  Constitutional:      General: He is not in acute distress.    Appearance: He is well-developed. He is not ill-appearing or toxic-appearing.  HENT:     Head: Normocephalic and atraumatic.     Right Ear: Hearing, tympanic membrane, ear canal and external ear normal.     Left Ear: Hearing, tympanic membrane, ear canal and external ear normal.     Nose: No congestion or rhinorrhea.     Right Sinus: No maxillary sinus tenderness or frontal sinus tenderness.     Left Sinus: No maxillary sinus tenderness or frontal sinus tenderness.     Mouth/Throat:     Lips: Pink.     Mouth: Mucous membranes are moist.     Pharynx: Uvula midline. No oropharyngeal exudate or posterior oropharyngeal erythema.     Tonsils: No tonsillar exudate.   Eyes:     Conjunctiva/sclera: Conjunctivae normal.     Pupils: Pupils are equal, round, and reactive to light.    Cardiovascular:     Rate and Rhythm: Normal rate and regular rhythm.     Heart sounds: S1 normal and S2 normal. No murmur heard. Pulmonary:     Effort: Pulmonary effort is normal. No respiratory distress.     Breath sounds: Normal breath sounds. No decreased breath sounds, wheezing, rhonchi or rales.  Abdominal:     General: Bowel sounds are normal.     Palpations: Abdomen is soft.     Tenderness: There is abdominal tenderness (Mild to moderate.) in the right lower quadrant, epigastric area, suprapubic area and left lower quadrant. There is right CVA tenderness (Mild and intermittent). There is no left CVA tenderness, guarding or rebound. Negative signs include Murphy's sign, Rovsing's sign and McBurney's sign.     Comments: Reassessment after GI cocktail: Abdominal pain has resolved with the use of the GI cocktail.   Musculoskeletal:        General: No  swelling.     Cervical back: Neck supple.  Lymphadenopathy:     Head:     Right side of head: No submental, submandibular,  tonsillar, preauricular or posterior auricular adenopathy.     Left side of head: No submental, submandibular, tonsillar, preauricular or posterior auricular adenopathy.     Cervical: No cervical adenopathy.     Right cervical: No superficial cervical adenopathy.    Left cervical: No superficial cervical adenopathy.   Skin:    General: Skin is warm and dry.     Capillary Refill: Capillary refill takes less than 2 seconds.     Findings: No rash.   Neurological:     Mental Status: He is alert and oriented to person, place, and time.   Psychiatric:        Mood and Affect: Mood normal.      UC Treatments / Results  Labs (all labs ordered are listed, but only abnormal results are displayed) Labs Reviewed - No data to display  EKG   Radiology No results found.  Procedures Procedures (including critical care time)  Medications Ordered in UC Medications  alum & mag hydroxide-simeth (MAALOX/MYLANTA) 200-200-20 MG/5ML suspension 30 mL (30 mLs Oral Given 06/02/24 1541)  lidocaine  (XYLOCAINE ) 2 % viscous mouth solution 15 mL (15 mLs Mouth/Throat Given 06/02/24 1541)    Initial Impression / Assessment and Plan / UC Course  I have reviewed the triage vital signs and the nursing notes.  Pertinent labs & imaging results that were available during my care of the patient were reviewed by me and considered in my medical decision making (see chart for details).  Plan of Care: Exam and history most consistent with GERD.  He had complete relief of his pain with the GI cocktail.  Omeprazole  40 mg daily.  Needs to get connected with primary care.  He may want H. pylori stool testing or referral to GI for further workup and management.  I reviewed the plan of care with the patient and/or the patient's guardian.  The patient and/or guardian had time to ask questions and  acknowledged that the questions were answered.  I provided instruction on symptoms or reasons to return here or to go to an ER, if symptoms/condition did not improve, worsened or if new symptoms occurred.  Final Clinical Impressions(s) / UC Diagnoses   Final diagnoses:  Abdominal pain, epigastric  Gastroesophageal reflux disease without esophagitis  History of Helicobacter pylori infection     Discharge Instructions      History and exam are most consistent with gastroesophageal reflux disease.  Pain completely resolved after GI cocktail.  Use omeprazole  40 mg 1 daily for acid reflux.  Make an appointment with primary care for further workup of pain and heartburn.  Patient may need stool H. pylori testing or other testing.  Patient may need referral to gastroenterology.  Follow-up here as needed.     ED Prescriptions     Medication Sig Dispense Auth. Provider   omeprazole  (PRILOSEC) 40 MG capsule Take 1 capsule (40 mg total) by mouth daily. 30 capsule Ival Domino, FNP      PDMP not reviewed this encounter.   Ival Domino, FNP 06/02/24 [phone]

## 2024-06-02 NOTE — ED Triage Notes (Signed)
 Pt c/o sharp pain right below his rib cage after he eats or drinks x 3-4 days ago, he also feels bloated in his stomach. Pt had a BM  yesterday morning.
